# Patient Record
Sex: Male | Born: 1935 | Race: White | Hispanic: No | Marital: Single
Health system: Southern US, Community
[De-identification: ages and names within clinical notes are randomized; demographics above are authoritative.]

---

## 2003-12-09 ENCOUNTER — Inpatient Hospital Stay (HOSPITAL_COMMUNITY): Admission: EM | Admit: 2003-12-09 | Discharge: 2003-12-14 | Payer: Self-pay | Admitting: *Deleted

## 2003-12-10 ENCOUNTER — Encounter: Payer: Self-pay | Admitting: Cardiovascular Disease

## 2003-12-13 ENCOUNTER — Encounter: Payer: Self-pay | Admitting: Cardiology

## 2003-12-14 ENCOUNTER — Inpatient Hospital Stay (HOSPITAL_COMMUNITY)
Admission: RE | Admit: 2003-12-14 | Discharge: 2003-12-23 | Payer: Self-pay | Admitting: Physical Medicine & Rehabilitation

## 2004-02-01 ENCOUNTER — Inpatient Hospital Stay (HOSPITAL_COMMUNITY): Admission: EM | Admit: 2004-02-01 | Discharge: 2004-02-09 | Payer: Self-pay | Admitting: Emergency Medicine

## 2004-04-06 ENCOUNTER — Encounter
Admission: RE | Admit: 2004-04-06 | Discharge: 2004-07-05 | Payer: Self-pay | Admitting: Physical Medicine & Rehabilitation

## 2004-04-06 ENCOUNTER — Ambulatory Visit: Payer: Self-pay | Admitting: Physical Medicine & Rehabilitation

## 2004-04-25 ENCOUNTER — Ambulatory Visit: Payer: Self-pay | Admitting: Family Medicine

## 2004-05-10 ENCOUNTER — Ambulatory Visit: Payer: Self-pay | Admitting: Family Medicine

## 2004-05-24 ENCOUNTER — Ambulatory Visit: Payer: Self-pay | Admitting: Family Medicine

## 2004-06-07 ENCOUNTER — Ambulatory Visit: Payer: Self-pay | Admitting: Family Medicine

## 2004-06-21 ENCOUNTER — Ambulatory Visit: Payer: Self-pay | Admitting: Family Medicine

## 2004-07-19 ENCOUNTER — Ambulatory Visit: Payer: Self-pay | Admitting: Family Medicine

## 2004-08-02 ENCOUNTER — Ambulatory Visit: Payer: Self-pay | Admitting: Family Medicine

## 2004-08-18 ENCOUNTER — Ambulatory Visit: Payer: Self-pay | Admitting: Family Medicine

## 2004-09-18 ENCOUNTER — Ambulatory Visit: Payer: Self-pay | Admitting: Family Medicine

## 2004-10-18 ENCOUNTER — Ambulatory Visit: Payer: Self-pay | Admitting: Family Medicine

## 2004-12-08 ENCOUNTER — Ambulatory Visit: Payer: Self-pay | Admitting: Internal Medicine

## 2004-12-22 ENCOUNTER — Ambulatory Visit: Payer: Self-pay | Admitting: Family Medicine

## 2005-01-10 ENCOUNTER — Ambulatory Visit: Payer: Self-pay | Admitting: Family Medicine

## 2005-01-19 ENCOUNTER — Ambulatory Visit: Payer: Self-pay | Admitting: Family Medicine

## 2005-02-20 ENCOUNTER — Ambulatory Visit: Payer: Self-pay | Admitting: Family Medicine

## 2005-02-21 ENCOUNTER — Ambulatory Visit: Payer: Self-pay | Admitting: Family Medicine

## 2005-03-10 ENCOUNTER — Inpatient Hospital Stay (HOSPITAL_COMMUNITY): Admission: EM | Admit: 2005-03-10 | Discharge: 2005-03-11 | Payer: Self-pay | Admitting: Emergency Medicine

## 2005-03-10 ENCOUNTER — Ambulatory Visit: Payer: Self-pay | Admitting: Internal Medicine

## 2005-03-13 ENCOUNTER — Ambulatory Visit: Payer: Self-pay | Admitting: Family Medicine

## 2005-03-20 ENCOUNTER — Ambulatory Visit: Payer: Self-pay

## 2005-03-23 ENCOUNTER — Ambulatory Visit: Payer: Self-pay | Admitting: Family Medicine

## 2005-03-30 ENCOUNTER — Ambulatory Visit: Payer: Self-pay | Admitting: Family Medicine

## 2005-05-03 ENCOUNTER — Ambulatory Visit: Payer: Self-pay | Admitting: Family Medicine

## 2005-05-17 ENCOUNTER — Ambulatory Visit: Payer: Self-pay | Admitting: Family Medicine

## 2005-06-05 ENCOUNTER — Ambulatory Visit: Payer: Self-pay | Admitting: Family Medicine

## 2005-11-15 ENCOUNTER — Ambulatory Visit: Payer: Self-pay | Admitting: Internal Medicine

## 2005-11-15 ENCOUNTER — Inpatient Hospital Stay (HOSPITAL_COMMUNITY): Admission: EM | Admit: 2005-11-15 | Discharge: 2005-11-19 | Payer: Self-pay | Admitting: Emergency Medicine

## 2005-11-19 ENCOUNTER — Ambulatory Visit: Payer: Self-pay | Admitting: Oncology

## 2005-11-21 ENCOUNTER — Ambulatory Visit: Payer: Self-pay | Admitting: Family Medicine

## 2006-02-06 ENCOUNTER — Ambulatory Visit: Payer: Self-pay | Admitting: *Deleted

## 2006-02-07 ENCOUNTER — Inpatient Hospital Stay (HOSPITAL_COMMUNITY): Admission: EM | Admit: 2006-02-07 | Discharge: 2006-02-14 | Payer: Self-pay | Admitting: Emergency Medicine

## 2006-02-08 ENCOUNTER — Ambulatory Visit: Payer: Self-pay | Admitting: Internal Medicine

## 2006-02-08 ENCOUNTER — Encounter (INDEPENDENT_AMBULATORY_CARE_PROVIDER_SITE_OTHER): Payer: Self-pay | Admitting: Cardiology

## 2006-03-26 ENCOUNTER — Ambulatory Visit: Payer: Self-pay | Admitting: Family Medicine

## 2007-02-01 ENCOUNTER — Emergency Department (HOSPITAL_COMMUNITY): Admission: EM | Admit: 2007-02-01 | Discharge: 2007-02-01 | Payer: Self-pay | Admitting: Emergency Medicine

## 2007-02-04 ENCOUNTER — Ambulatory Visit: Payer: Self-pay | Admitting: Family Medicine

## 2007-02-04 DIAGNOSIS — R55 Syncope and collapse: Secondary | ICD-10-CM

## 2007-02-04 DIAGNOSIS — E0590 Thyrotoxicosis, unspecified without thyrotoxic crisis or storm: Secondary | ICD-10-CM | POA: Insufficient documentation

## 2007-02-04 DIAGNOSIS — K219 Gastro-esophageal reflux disease without esophagitis: Secondary | ICD-10-CM | POA: Insufficient documentation

## 2007-02-04 DIAGNOSIS — Z86718 Personal history of other venous thrombosis and embolism: Secondary | ICD-10-CM | POA: Insufficient documentation

## 2007-02-04 DIAGNOSIS — F329 Major depressive disorder, single episode, unspecified: Secondary | ICD-10-CM

## 2007-02-04 DIAGNOSIS — I10 Essential (primary) hypertension: Secondary | ICD-10-CM | POA: Insufficient documentation

## 2007-02-18 ENCOUNTER — Ambulatory Visit: Payer: Self-pay | Admitting: Family Medicine

## 2007-02-18 LAB — CONVERTED CEMR LAB: Prothrombin Time: 13.6 s

## 2007-03-04 ENCOUNTER — Ambulatory Visit: Payer: Self-pay | Admitting: Family Medicine

## 2007-03-04 DIAGNOSIS — L0211 Cutaneous abscess of neck: Secondary | ICD-10-CM | POA: Insufficient documentation

## 2007-03-04 DIAGNOSIS — L03221 Cellulitis of neck: Secondary | ICD-10-CM

## 2007-03-18 ENCOUNTER — Ambulatory Visit: Payer: Self-pay | Admitting: Family Medicine

## 2007-07-02 ENCOUNTER — Telehealth: Payer: Self-pay | Admitting: Family Medicine

## 2007-08-15 ENCOUNTER — Telehealth: Payer: Self-pay | Admitting: Family Medicine

## 2008-02-16 ENCOUNTER — Ambulatory Visit: Payer: Self-pay | Admitting: Family Medicine

## 2008-02-16 LAB — CONVERTED CEMR LAB
INR: 1.1
Prothrombin Time: 12.9 s

## 2008-02-18 LAB — CONVERTED CEMR LAB
ALT: 16 units/L (ref 0–53)
AST: 19 units/L (ref 0–37)
Albumin: 3 g/dL — ABNORMAL LOW (ref 3.5–5.2)
BUN: 22 mg/dL (ref 6–23)
Basophils Absolute: 0 10*3/uL (ref 0.0–0.1)
Basophils Relative: 0.7 % (ref 0.0–3.0)
Chloride: 111 meq/L (ref 96–112)
Cholesterol: 235 mg/dL (ref 0–200)
Creatinine, Ser: 1.4 mg/dL (ref 0.4–1.5)
Eosinophils Relative: 2.2 % (ref 0.0–5.0)
GFR calc Af Amer: 64 mL/min
Glucose, Bld: 160 mg/dL — ABNORMAL HIGH (ref 70–99)
HDL: 27.6 mg/dL — ABNORMAL LOW (ref >39.0)
Monocytes Absolute: 0.4 10*3/uL (ref 0.1–1.0)
Monocytes Relative: 6.6 % (ref 3.0–12.0)
Neutro Abs: 3.8 10*3/uL (ref 1.4–7.7)
Platelets: 291 10*3/uL (ref 150–400)
Potassium: 3.2 meq/L — ABNORMAL LOW (ref 3.5–5.1)
RBC: 4.42 M/uL (ref 4.22–5.81)
RDW: 11.8 % (ref 11.5–14.6)
Total Protein: 6.8 g/dL (ref 6.0–8.3)
Vitamin B-12: 310 pg/mL (ref 211–911)
WBC: 6.7 10*3/uL (ref 4.5–10.5)

## 2008-02-24 ENCOUNTER — Ambulatory Visit: Payer: Self-pay | Admitting: Family Medicine

## 2008-02-24 LAB — CONVERTED CEMR LAB: INR: 1.7

## 2008-03-03 ENCOUNTER — Ambulatory Visit: Payer: Self-pay | Admitting: Family Medicine

## 2008-03-03 DIAGNOSIS — E119 Type 2 diabetes mellitus without complications: Secondary | ICD-10-CM

## 2008-03-05 LAB — CONVERTED CEMR LAB: Hgb A1c MFr Bld: 6.3 % — ABNORMAL HIGH (ref 4.6–6.0)

## 2008-03-16 ENCOUNTER — Ambulatory Visit: Payer: Self-pay | Admitting: Family Medicine

## 2008-03-16 LAB — CONVERTED CEMR LAB: INR: 8

## 2008-04-01 ENCOUNTER — Ambulatory Visit: Payer: Self-pay | Admitting: Family Medicine

## 2008-04-16 ENCOUNTER — Ambulatory Visit: Payer: Self-pay | Admitting: Family Medicine

## 2008-04-16 LAB — CONVERTED CEMR LAB
INR: 1.1
Prothrombin Time: 13.3 s

## 2008-05-17 ENCOUNTER — Ambulatory Visit: Payer: Self-pay | Admitting: Family Medicine

## 2008-05-17 LAB — CONVERTED CEMR LAB
INR: 1
Prothrombin Time: 12.5 s

## 2008-06-01 ENCOUNTER — Encounter: Payer: Self-pay | Admitting: Family Medicine

## 2008-06-05 ENCOUNTER — Ambulatory Visit: Payer: Self-pay | Admitting: Diagnostic Radiology

## 2008-06-05 ENCOUNTER — Emergency Department (HOSPITAL_BASED_OUTPATIENT_CLINIC_OR_DEPARTMENT_OTHER): Admission: EM | Admit: 2008-06-05 | Discharge: 2008-06-05 | Payer: Self-pay | Admitting: Emergency Medicine

## 2008-06-08 ENCOUNTER — Ambulatory Visit: Payer: Self-pay | Admitting: Family Medicine

## 2008-06-08 DIAGNOSIS — S20219A Contusion of unspecified front wall of thorax, initial encounter: Secondary | ICD-10-CM

## 2008-06-08 DIAGNOSIS — S5000XA Contusion of unspecified elbow, initial encounter: Secondary | ICD-10-CM

## 2008-06-15 ENCOUNTER — Ambulatory Visit: Payer: Self-pay | Admitting: Family Medicine

## 2008-06-15 LAB — CONVERTED CEMR LAB
INR: 5.4
Prothrombin Time: 28.2 s

## 2008-06-29 ENCOUNTER — Ambulatory Visit: Payer: Self-pay | Admitting: Family Medicine

## 2008-07-14 ENCOUNTER — Emergency Department (HOSPITAL_COMMUNITY): Admission: EM | Admit: 2008-07-14 | Discharge: 2008-07-14 | Payer: Self-pay | Admitting: Emergency Medicine

## 2009-01-28 ENCOUNTER — Ambulatory Visit: Payer: Self-pay | Admitting: Family Medicine

## 2009-01-28 DIAGNOSIS — N401 Enlarged prostate with lower urinary tract symptoms: Secondary | ICD-10-CM

## 2009-01-28 DIAGNOSIS — E039 Hypothyroidism, unspecified: Secondary | ICD-10-CM | POA: Insufficient documentation

## 2009-02-02 LAB — CONVERTED CEMR LAB
AST: 20 units/L (ref 0–37)
Basophils Absolute: 0 10*3/uL (ref 0.0–0.1)
Basophils Relative: 0.7 % (ref 0.0–3.0)
CO2: 29 meq/L (ref 19–32)
Calcium: 8.6 mg/dL (ref 8.4–10.5)
Chloride: 106 meq/L (ref 96–112)
Creatinine, Ser: 1.4 mg/dL (ref 0.4–1.5)
Direct LDL: 220.1 mg/dL
GFR calc non Af Amer: 52.97 mL/min (ref >60)
HCT: 40.9 % (ref 39.0–52.0)
Hgb A1c MFr Bld: 6 % (ref 4.6–6.5)
Lymphocytes Relative: 32.2 % (ref 12.0–46.0)
Lymphs Abs: 2.3 10*3/uL (ref 0.7–4.0)
Neutro Abs: 4.3 10*3/uL (ref 1.4–7.7)
Neutrophils Relative %: 60.4 % (ref 43.0–77.0)
Platelets: 263 10*3/uL (ref 150.0–400.0)
Sodium: 142 meq/L (ref 135–145)
Total Bilirubin: 1.4 mg/dL — ABNORMAL HIGH (ref 0.3–1.2)
Total CHOL/HDL Ratio: 10
Total Protein: 6.6 g/dL (ref 6.0–8.3)
Triglycerides: 150 mg/dL — ABNORMAL HIGH (ref 0.0–149.0)
VLDL: 30 mg/dL (ref 0.0–40.0)

## 2009-02-04 ENCOUNTER — Telehealth: Payer: Self-pay | Admitting: Family Medicine

## 2009-03-21 ENCOUNTER — Telehealth: Payer: Self-pay | Admitting: Family Medicine

## 2009-06-20 ENCOUNTER — Inpatient Hospital Stay (HOSPITAL_COMMUNITY): Admission: EM | Admit: 2009-06-20 | Discharge: 2009-06-22 | Payer: Self-pay | Admitting: Emergency Medicine

## 2009-06-21 ENCOUNTER — Ambulatory Visit: Payer: Self-pay | Admitting: Surgery

## 2009-06-21 ENCOUNTER — Encounter (INDEPENDENT_AMBULATORY_CARE_PROVIDER_SITE_OTHER): Payer: Self-pay | Admitting: Internal Medicine

## 2009-06-21 ENCOUNTER — Encounter (INDEPENDENT_AMBULATORY_CARE_PROVIDER_SITE_OTHER): Payer: Self-pay | Admitting: Emergency Medicine

## 2009-06-21 ENCOUNTER — Encounter: Payer: Self-pay | Admitting: Family Medicine

## 2009-08-24 ENCOUNTER — Telehealth: Payer: Self-pay | Admitting: Family Medicine

## 2009-09-06 ENCOUNTER — Ambulatory Visit: Payer: Self-pay | Admitting: Family Medicine

## 2009-09-06 DIAGNOSIS — I4891 Unspecified atrial fibrillation: Secondary | ICD-10-CM

## 2009-09-06 DIAGNOSIS — I509 Heart failure, unspecified: Secondary | ICD-10-CM | POA: Insufficient documentation

## 2009-09-06 DIAGNOSIS — I2699 Other pulmonary embolism without acute cor pulmonale: Secondary | ICD-10-CM

## 2009-09-07 ENCOUNTER — Telehealth: Payer: Self-pay | Admitting: Family Medicine

## 2009-09-07 LAB — CONVERTED CEMR LAB
ALT: 13 units/L (ref 0–53)
AST: 16 units/L (ref 0–37)
Alkaline Phosphatase: 49 units/L (ref 39–117)
BUN: 23 mg/dL (ref 6–23)
Basophils Absolute: 0 10*3/uL (ref 0.0–0.1)
CO2: 28 meq/L (ref 19–32)
Calcium: 8.7 mg/dL (ref 8.4–10.5)
Eosinophils Absolute: 0.2 10*3/uL (ref 0.0–0.7)
GFR calc non Af Amer: 52.88 mL/min (ref >60)
HCT: 39.9 % (ref 39.0–52.0)
Lymphs Abs: 2 10*3/uL (ref 0.7–4.0)
MCV: 94 fL (ref 78.0–100.0)
Monocytes Absolute: 0.4 10*3/uL (ref 0.1–1.0)
Neutrophils Relative %: 59.3 % (ref 43.0–77.0)
Platelets: 225 10*3/uL (ref 150.0–400.0)
Pro B Natriuretic peptide (BNP): 323 pg/mL — ABNORMAL HIGH (ref 0.0–100.0)
RDW: 12.1 % (ref 11.5–14.6)
TSH: 2.82 microintl units/mL (ref 0.35–5.50)
Total Protein: 6.6 g/dL (ref 6.0–8.3)
Vitamin B-12: 358 pg/mL (ref 211–911)
WBC: 6.5 10*3/uL (ref 4.5–10.5)

## 2009-09-26 ENCOUNTER — Ambulatory Visit: Payer: Self-pay | Admitting: Cardiology

## 2009-09-26 DIAGNOSIS — I428 Other cardiomyopathies: Secondary | ICD-10-CM | POA: Insufficient documentation

## 2009-09-26 DIAGNOSIS — E785 Hyperlipidemia, unspecified: Secondary | ICD-10-CM

## 2009-09-27 ENCOUNTER — Telehealth (INDEPENDENT_AMBULATORY_CARE_PROVIDER_SITE_OTHER): Payer: Self-pay | Admitting: *Deleted

## 2009-09-28 ENCOUNTER — Ambulatory Visit: Payer: Self-pay | Admitting: Cardiovascular Disease

## 2009-09-28 ENCOUNTER — Encounter (HOSPITAL_COMMUNITY): Admission: RE | Admit: 2009-09-28 | Discharge: 2009-11-17 | Payer: Self-pay | Admitting: Cardiology

## 2009-09-28 ENCOUNTER — Ambulatory Visit: Payer: Self-pay

## 2009-10-04 ENCOUNTER — Ambulatory Visit: Payer: Self-pay | Admitting: Family Medicine

## 2009-10-04 LAB — CONVERTED CEMR LAB
INR: 3.5
Prothrombin Time: 22.7 s

## 2009-10-20 ENCOUNTER — Ambulatory Visit: Payer: Self-pay | Admitting: Cardiology

## 2009-11-03 ENCOUNTER — Ambulatory Visit: Payer: Self-pay | Admitting: Cardiology

## 2009-11-03 ENCOUNTER — Ambulatory Visit (HOSPITAL_COMMUNITY): Admission: RE | Admit: 2009-11-03 | Discharge: 2009-11-03 | Payer: Self-pay | Admitting: Cardiology

## 2009-11-03 ENCOUNTER — Ambulatory Visit: Payer: Self-pay | Admitting: Internal Medicine

## 2009-11-03 ENCOUNTER — Ambulatory Visit: Payer: Self-pay

## 2009-11-03 ENCOUNTER — Encounter: Payer: Self-pay | Admitting: Cardiology

## 2009-11-03 ENCOUNTER — Telehealth: Payer: Self-pay | Admitting: Cardiology

## 2009-11-09 ENCOUNTER — Telehealth: Payer: Self-pay | Admitting: Cardiology

## 2009-11-09 LAB — CONVERTED CEMR LAB
BUN: 21 mg/dL (ref 6–23)
Calcium: 8.5 mg/dL (ref 8.4–10.5)
Glucose, Bld: 112 mg/dL — ABNORMAL HIGH (ref 70–99)
Potassium: 3.7 meq/L (ref 3.5–5.1)

## 2009-11-15 ENCOUNTER — Ambulatory Visit: Payer: Self-pay | Admitting: Family Medicine

## 2009-11-16 ENCOUNTER — Telehealth: Payer: Self-pay | Admitting: Family Medicine

## 2009-12-13 ENCOUNTER — Ambulatory Visit: Payer: Self-pay | Admitting: Family Medicine

## 2010-01-11 ENCOUNTER — Ambulatory Visit: Payer: Self-pay | Admitting: Family Medicine

## 2010-01-11 LAB — CONVERTED CEMR LAB: INR: 3.6

## 2010-01-26 ENCOUNTER — Observation Stay (HOSPITAL_COMMUNITY): Admission: EM | Admit: 2010-01-26 | Discharge: 2010-01-27 | Payer: Self-pay | Admitting: Emergency Medicine

## 2010-02-06 ENCOUNTER — Telehealth: Payer: Self-pay | Admitting: Family Medicine

## 2010-02-08 ENCOUNTER — Ambulatory Visit: Payer: Self-pay | Admitting: Family Medicine

## 2010-02-13 ENCOUNTER — Ambulatory Visit: Payer: Self-pay | Admitting: Family Medicine

## 2010-02-14 ENCOUNTER — Telehealth: Payer: Self-pay | Admitting: Family Medicine

## 2010-03-08 ENCOUNTER — Ambulatory Visit: Payer: Self-pay | Admitting: Family Medicine

## 2010-03-08 LAB — CONVERTED CEMR LAB: INR: 4.1

## 2010-03-22 ENCOUNTER — Ambulatory Visit: Payer: Self-pay | Admitting: Family Medicine

## 2010-04-19 ENCOUNTER — Ambulatory Visit: Payer: Self-pay | Admitting: Family Medicine

## 2010-05-08 ENCOUNTER — Inpatient Hospital Stay (HOSPITAL_COMMUNITY)
Admission: EM | Admit: 2010-05-08 | Discharge: 2010-05-10 | Payer: Self-pay | Source: Home / Self Care | Admitting: Emergency Medicine

## 2010-05-08 ENCOUNTER — Ambulatory Visit: Payer: Self-pay | Admitting: Cardiology

## 2010-05-15 ENCOUNTER — Telehealth: Payer: Self-pay | Admitting: Family Medicine

## 2010-05-28 ENCOUNTER — Inpatient Hospital Stay (HOSPITAL_COMMUNITY)
Admission: EM | Admit: 2010-05-28 | Discharge: 2010-06-01 | Payer: Self-pay | Source: Home / Self Care | Attending: Internal Medicine | Admitting: Internal Medicine

## 2010-05-29 ENCOUNTER — Ambulatory Visit: Payer: Self-pay | Admitting: Family Medicine

## 2010-05-29 ENCOUNTER — Encounter (INDEPENDENT_AMBULATORY_CARE_PROVIDER_SITE_OTHER): Payer: Self-pay | Admitting: Family Medicine

## 2010-05-30 ENCOUNTER — Encounter: Payer: Self-pay | Admitting: Family Medicine

## 2010-05-31 ENCOUNTER — Encounter (INDEPENDENT_AMBULATORY_CARE_PROVIDER_SITE_OTHER): Payer: Self-pay | Admitting: Family Medicine

## 2010-06-23 ENCOUNTER — Emergency Department (HOSPITAL_COMMUNITY)
Admission: EM | Admit: 2010-06-23 | Discharge: 2010-06-23 | Payer: Self-pay | Source: Home / Self Care | Admitting: Emergency Medicine

## 2010-06-23 ENCOUNTER — Inpatient Hospital Stay (HOSPITAL_COMMUNITY)
Admission: AD | Admit: 2010-06-23 | Discharge: 2010-07-19 | Disposition: E | Payer: Self-pay | Attending: Pulmonary Disease | Admitting: Pulmonary Disease

## 2010-06-23 LAB — URINALYSIS, ROUTINE W REFLEX MICROSCOPIC
Bilirubin Urine: NEGATIVE
Hemoglobin, Urine: NEGATIVE
Ketones, ur: NEGATIVE mg/dL
Leukocytes, UA: NEGATIVE
Nitrite: NEGATIVE
Protein, ur: 100 mg/dL — AB
Specific Gravity, Urine: 1.02 (ref 1.005–1.030)
Urine Glucose, Fasting: NEGATIVE mg/dL
Urobilinogen, UA: 0.2 mg/dL (ref 0.0–1.0)
pH: 5.5 (ref 5.0–8.0)

## 2010-06-23 LAB — URINE MICROSCOPIC-ADD ON

## 2010-06-23 LAB — RAPID URINE DRUG SCREEN, HOSP PERFORMED
Amphetamines: NOT DETECTED
Barbiturates: NOT DETECTED
Benzodiazepines: NOT DETECTED
Cocaine: NOT DETECTED
Opiates: NOT DETECTED
Tetrahydrocannabinol: NOT DETECTED

## 2010-06-28 ENCOUNTER — Encounter (INDEPENDENT_AMBULATORY_CARE_PROVIDER_SITE_OTHER): Payer: Self-pay | Admitting: Internal Medicine

## 2010-06-28 ENCOUNTER — Encounter: Payer: Self-pay | Admitting: Family Medicine

## 2010-07-03 LAB — BLOOD GAS, ARTERIAL
Acid-Base Excess: 0.3 mmol/L (ref 0.0–2.0)
Acid-base deficit: 2.9 mmol/L — ABNORMAL HIGH (ref 0.0–2.0)
Bicarbonate: 23.9 mEq/L (ref 20.0–24.0)
Bicarbonate: 20.6 mEq/L (ref 20.0–24.0)
Drawn by: 31288
Drawn by: 129771
FIO2: 0.6 %
FIO2: 0.3 %
MECHVT: 600 mL
O2 Saturation: 99.8 %
O2 Saturation: 98.8 %
PEEP: 5 cmH2O
PEEP: 5 cmH2O
Patient temperature: 100
Patient temperature: 98.6
Pressure support: 5 cmH2O
RATE: 12 resp/min
TCO2: 24.9 mmol/L (ref 0–100)
TCO2: 21.6 mmol/L (ref 0–100)
pCO2 arterial: 36.3 mmHg (ref 35.0–45.0)
pCO2 arterial: 30.8 mmHg — ABNORMAL LOW (ref 35.0–45.0)
pH, Arterial: 7.437 (ref 7.350–7.450)
pH, Arterial: 7.441 (ref 7.350–7.450)
pO2, Arterial: 248 mmHg — ABNORMAL HIGH (ref 80.0–100.0)
pO2, Arterial: 105 mmHg — ABNORMAL HIGH (ref 80.0–100.0)

## 2010-07-03 LAB — BASIC METABOLIC PANEL
BUN: 29 mg/dL — ABNORMAL HIGH (ref 6–23)
BUN: 27 mg/dL — ABNORMAL HIGH (ref 6–23)
BUN: 18 mg/dL (ref 6–23)
BUN: 20 mg/dL (ref 6–23)
BUN: 10 mg/dL (ref 6–23)
CO2: 26 mEq/L (ref 19–32)
CO2: 27 mEq/L (ref 19–32)
CO2: 24 mEq/L (ref 19–32)
CO2: 22 mEq/L (ref 19–32)
CO2: 24 mEq/L (ref 19–32)
Calcium: 9.2 mg/dL (ref 8.4–10.5)
Calcium: 8.9 mg/dL (ref 8.4–10.5)
Calcium: 8.6 mg/dL (ref 8.4–10.5)
Calcium: 8.8 mg/dL (ref 8.4–10.5)
Calcium: 9 mg/dL (ref 8.4–10.5)
Chloride: 112 mEq/L (ref 96–112)
Chloride: 118 mEq/L — ABNORMAL HIGH (ref 96–112)
Chloride: 121 mEq/L — ABNORMAL HIGH (ref 96–112)
Chloride: 124 mEq/L — ABNORMAL HIGH (ref 96–112)
Chloride: 110 mEq/L (ref 96–112)
Creatinine, Ser: 1.49 mg/dL (ref 0.4–1.5)
Creatinine, Ser: 1.61 mg/dL — ABNORMAL HIGH (ref 0.4–1.5)
Creatinine, Ser: 1.24 mg/dL (ref 0.4–1.5)
Creatinine, Ser: 1.38 mg/dL (ref 0.4–1.5)
Creatinine, Ser: 0.69 mg/dL (ref 0.4–1.5)
GFR calc Af Amer: 56 mL/min — ABNORMAL LOW (ref >60)
GFR calc Af Amer: 51 mL/min — ABNORMAL LOW (ref >60)
GFR calc Af Amer: >60 mL/min (ref >60)
GFR calc Af Amer: >60 mL/min (ref >60)
GFR calc Af Amer: >60 mL/min (ref >60)
GFR calc non Af Amer: 46 mL/min — ABNORMAL LOW (ref >60)
GFR calc non Af Amer: 42 mL/min — ABNORMAL LOW (ref >60)
GFR calc non Af Amer: 57 mL/min — ABNORMAL LOW (ref >60)
GFR calc non Af Amer: 51 mL/min — ABNORMAL LOW (ref >60)
GFR calc non Af Amer: >60 mL/min (ref >60)
Glucose, Bld: 166 mg/dL — ABNORMAL HIGH (ref 70–99)
Glucose, Bld: 173 mg/dL — ABNORMAL HIGH (ref 70–99)
Glucose, Bld: 132 mg/dL — ABNORMAL HIGH (ref 70–99)
Glucose, Bld: 149 mg/dL — ABNORMAL HIGH (ref 70–99)
Glucose, Bld: 146 mg/dL — ABNORMAL HIGH (ref 70–99)
Potassium: 3.6 mEq/L (ref 3.5–5.1)
Potassium: 3.6 mEq/L (ref 3.5–5.1)
Potassium: 3.4 mEq/L — ABNORMAL LOW (ref 3.5–5.1)
Potassium: 3.4 mEq/L — ABNORMAL LOW (ref 3.5–5.1)
Potassium: 4 mEq/L (ref 3.5–5.1)
Sodium: 146 mEq/L — ABNORMAL HIGH (ref 135–145)
Sodium: 150 mEq/L — ABNORMAL HIGH (ref 135–145)
Sodium: 152 mEq/L — ABNORMAL HIGH (ref 135–145)
Sodium: 153 mEq/L — ABNORMAL HIGH (ref 135–145)
Sodium: 141 mEq/L (ref 135–145)

## 2010-07-03 LAB — DIFFERENTIAL
Basophils Absolute: 0 10*3/uL (ref 0.0–0.1)
Basophils Absolute: 0 10*3/uL (ref 0.0–0.1)
Basophils Relative: 0 % (ref 0–1)
Basophils Relative: 0 % (ref 0–1)
Eosinophils Absolute: 0 10*3/uL (ref 0.0–0.7)
Eosinophils Absolute: 0.1 10*3/uL (ref 0.0–0.7)
Eosinophils Relative: 0 % (ref 0–5)
Eosinophils Relative: 1 % (ref 0–5)
Lymphocytes Relative: 11 % — ABNORMAL LOW (ref 12–46)
Lymphocytes Relative: 14 % (ref 12–46)
Lymphs Abs: 1.4 10*3/uL (ref 0.7–4.0)
Lymphs Abs: 1.1 10*3/uL (ref 0.7–4.0)
Monocytes Absolute: 0.7 10*3/uL (ref 0.1–1.0)
Monocytes Absolute: 0.6 10*3/uL (ref 0.1–1.0)
Monocytes Relative: 5 % (ref 3–12)
Monocytes Relative: 7 % (ref 3–12)
Neutro Abs: 11.4 10*3/uL — ABNORMAL HIGH (ref 1.7–7.7)
Neutro Abs: 6.6 10*3/uL (ref 1.7–7.7)
Neutrophils Relative %: 84 % — ABNORMAL HIGH (ref 43–77)
Neutrophils Relative %: 79 % — ABNORMAL HIGH (ref 43–77)

## 2010-07-03 LAB — CBC
HCT: 39.3 % (ref 39.0–52.0)
HCT: 37 % — ABNORMAL LOW (ref 39.0–52.0)
HCT: 33.7 % — ABNORMAL LOW (ref 39.0–52.0)
HCT: 33.2 % — ABNORMAL LOW (ref 39.0–52.0)
HCT: 35 % — ABNORMAL LOW (ref 39.0–52.0)
Hemoglobin: 12.7 g/dL — ABNORMAL LOW (ref 13.0–17.0)
Hemoglobin: 12.2 g/dL — ABNORMAL LOW (ref 13.0–17.0)
Hemoglobin: 10.6 g/dL — ABNORMAL LOW (ref 13.0–17.0)
Hemoglobin: 10.6 g/dL — ABNORMAL LOW (ref 13.0–17.0)
Hemoglobin: 10.9 g/dL — ABNORMAL LOW (ref 13.0–17.0)
MCH: 30.8 pg (ref 26.0–34.0)
MCH: 31.3 pg (ref 26.0–34.0)
MCH: 30.3 pg (ref 26.0–34.0)
MCH: 31.2 pg (ref 26.0–34.0)
MCH: 30.3 pg (ref 26.0–34.0)
MCHC: 32.3 g/dL (ref 30.0–36.0)
MCHC: 33 g/dL (ref 30.0–36.0)
MCHC: 31.5 g/dL (ref 30.0–36.0)
MCHC: 31.9 g/dL (ref 30.0–36.0)
MCHC: 31.1 g/dL (ref 30.0–36.0)
MCV: 95.2 fL (ref 78.0–100.0)
MCV: 94.9 fL (ref 78.0–100.0)
MCV: 96.3 fL (ref 78.0–100.0)
MCV: 97.6 fL (ref 78.0–100.0)
MCV: 97.2 fL (ref 78.0–100.0)
Platelets: 278 10*3/uL (ref 150–400)
Platelets: 276 10*3/uL (ref 150–400)
Platelets: 196 10*3/uL (ref 150–400)
Platelets: 207 10*3/uL (ref 150–400)
Platelets: 205 10*3/uL (ref 150–400)
RBC: 4.13 MIL/uL — ABNORMAL LOW (ref 4.22–5.81)
RBC: 3.9 MIL/uL — ABNORMAL LOW (ref 4.22–5.81)
RBC: 3.5 MIL/uL — ABNORMAL LOW (ref 4.22–5.81)
RBC: 3.4 MIL/uL — ABNORMAL LOW (ref 4.22–5.81)
RBC: 3.6 MIL/uL — ABNORMAL LOW (ref 4.22–5.81)
RDW: 13.1 % (ref 11.5–15.5)
RDW: 13.4 % (ref 11.5–15.5)
RDW: 13.7 % (ref 11.5–15.5)
RDW: 13.9 % (ref 11.5–15.5)
RDW: 13.7 % (ref 11.5–15.5)
WBC: 13.6 10*3/uL — ABNORMAL HIGH (ref 4.0–10.5)
WBC: 13.7 10*3/uL — ABNORMAL HIGH (ref 4.0–10.5)
WBC: 8.4 10*3/uL (ref 4.0–10.5)
WBC: 7.7 10*3/uL (ref 4.0–10.5)
WBC: 10.4 10*3/uL (ref 4.0–10.5)

## 2010-07-03 LAB — GLUCOSE, CAPILLARY
Glucose-Capillary: 155 mg/dL — ABNORMAL HIGH (ref 70–99)
Glucose-Capillary: 138 mg/dL — ABNORMAL HIGH (ref 70–99)
Glucose-Capillary: 138 mg/dL — ABNORMAL HIGH (ref 70–99)
Glucose-Capillary: 141 mg/dL — ABNORMAL HIGH (ref 70–99)
Glucose-Capillary: 142 mg/dL — ABNORMAL HIGH (ref 70–99)
Glucose-Capillary: 143 mg/dL — ABNORMAL HIGH (ref 70–99)
Glucose-Capillary: 127 mg/dL — ABNORMAL HIGH (ref 70–99)
Glucose-Capillary: 129 mg/dL — ABNORMAL HIGH (ref 70–99)
Glucose-Capillary: 140 mg/dL — ABNORMAL HIGH (ref 70–99)
Glucose-Capillary: 130 mg/dL — ABNORMAL HIGH (ref 70–99)
Glucose-Capillary: 147 mg/dL — ABNORMAL HIGH (ref 70–99)
Glucose-Capillary: 151 mg/dL — ABNORMAL HIGH (ref 70–99)
Glucose-Capillary: 157 mg/dL — ABNORMAL HIGH (ref 70–99)
Glucose-Capillary: 160 mg/dL — ABNORMAL HIGH (ref 70–99)
Glucose-Capillary: 168 mg/dL — ABNORMAL HIGH (ref 70–99)
Glucose-Capillary: 171 mg/dL — ABNORMAL HIGH (ref 70–99)
Glucose-Capillary: 204 mg/dL — ABNORMAL HIGH (ref 70–99)

## 2010-07-03 LAB — PREPARE FRESH FROZEN PLASMA
Unit division: 0
Unit division: 0
Unit division: 0
Unit division: 0

## 2010-07-03 LAB — APTT
aPTT: 32 seconds (ref 24–37)
aPTT: 27 seconds (ref 24–37)
aPTT: 25 seconds (ref 24–37)

## 2010-07-03 LAB — PROTIME-INR
INR: 1.2 (ref 0.00–1.49)
INR: 1.11 (ref 0.00–1.49)
Prothrombin Time: 15.6 seconds — ABNORMAL HIGH (ref 11.6–15.2)
Prothrombin Time: 15.4 seconds — ABNORMAL HIGH (ref 11.6–15.2)
Prothrombin Time: 14.5 seconds (ref 11.6–15.2)

## 2010-07-03 LAB — URINE CULTURE
Colony Count: NO GROWTH
Culture  Setup Time: 201201070216
Culture: NO GROWTH

## 2010-07-03 LAB — TYPE AND SCREEN
ABO/RH(D): A POS
Antibody Screen: NEGATIVE

## 2010-07-03 LAB — SAMPLE TO BLOOD BANK

## 2010-07-03 LAB — CULTURE, RESPIRATORY W GRAM STAIN

## 2010-07-03 LAB — ABO/RH: ABO/RH(D): A POS

## 2010-07-03 LAB — MRSA PCR SCREENING: MRSA by PCR: NEGATIVE

## 2010-07-03 LAB — MAGNESIUM: Magnesium: 2.1 mg/dL (ref 1.5–2.5)

## 2010-07-16 LAB — CONVERTED CEMR LAB
Albumin: 3.3 g/dL — ABNORMAL LOW (ref 3.5–5.2)
BUN: 24 mg/dL — ABNORMAL HIGH (ref 6–23)
CO2: 30 meq/L (ref 19–32)
Calcium: 8.6 mg/dL (ref 8.4–10.5)
Direct LDL: 159.7 mg/dL
GFR calc non Af Amer: 52.87 mL/min (ref >60)
Glucose, Bld: 93 mg/dL (ref 70–99)
Prothrombin Time: 12.4 s
VLDL: 31.2 mg/dL (ref 0.0–40.0)

## 2010-07-18 NOTE — Assessment & Plan Note (Signed)
Summary: PT//CCM/PT RSC/CJR  Nurse Visit   Allergies: 1)  ! Tylenol Laboratory Results   Blood Tests   Date/Time Received: Nov 15, 2009 2:40 PM  Date/Time Reported: Nov 15, 2009 2:40 PM    INR: 1.7   (Normal Range: 0.88-1.12   Therap INR: 2.0-3.5) Comments: Wynona Canes, CMA  Nov 15, 2009 2:40 PM     Orders Added: 1)  Est. Patient Level I [99211] 2)  Protime [04540JW]  Laboratory Results   Blood Tests      INR: 1.7   (Normal Range: 0.88-1.12   Therap INR: 2.0-3.5) Comments: Wynona Canes, CMA  Nov 15, 2009 2:40 PM       ANTICOAGULATION RECORD PREVIOUS REGIMEN & LAB RESULTS Anticoagulation Diagnosis:  V58.83,V58.61,415.19 on  02/16/2008 Previous INR Goal Range:  2.0-3.0 on  02/16/2008 Previous INR:  3.5 on  10/04/2009 Previous Coumadin Dose(mg):  10mg  qd on  04/16/2008 Previous Regimen:  same on  10/04/2009 Previous Coagulation Comments:  Missed 1 day on  04/16/2008  NEW REGIMEN & LAB RESULTS Current INR: 1.7 Current Coumadin Dose(mg): 10mg  on tue,thu 7.5mg  on other days Regimen: same  (no change)  MEDICATIONS MULTIVITAMINS   TABS (MULTIPLE VITAMIN) 1 by mouth once daily KLOR-CON M20 20 MEQ TBCR (POTASSIUM CHLORIDE CRYS CR) take one tablet once daily COUMADIN 5 MG TABS (WARFARIN SODIUM) 10 mg 3 days, 7.5 mg 4 days CITRUCEL   POWD (METHYLCELLULOSE (LAXATIVE)) once daily ZOLOFT 25 MG  TABS (SERTRALINE HCL) 3 once daily RAMIPRIL 10 MG CAPS (RAMIPRIL) two tablets daily METFORMIN HCL 500 MG TABS (METFORMIN HCL) 1 once daily OMEPRAZOLE 20 MG CPDR (OMEPRAZOLE) one by mouth daily SIMVASTATIN 40 MG TABS (SIMVASTATIN) one tablet daily BISOPROLOL FUMARATE 5 MG TABS (BISOPROLOL FUMARATE) one tablet daily CATAPRES 0.2 MG TABS (CLONIDINE HCL) 1 once daily LEVOTHROID 125 MCG TABS (LEVOTHYROXINE SODIUM) take one tablet once daily   Anticoagulation Visit Questionnaire      Coumadin dose missed/changed:  No      Abnormal Bleeding Symptoms:  No   Any diet  changes including alcohol intake, vegetables or greens since the last visit:  No Any illnesses or hospitalizations since the last visit:  No Any signs of clotting since the last visit (including chest discomfort, dizziness, shortness of breath, arm tingling, slurred speech, swelling or redness in leg):  No

## 2010-07-18 NOTE — Progress Notes (Signed)
Summary: calling back  Phone Note Call from Patient Call back at Home Phone [phone]   Caller: Daughter- Lawson Fiscal [phone] Reason for Call: Talk to Nurse Summary of Call: calling back to speak with ann Initial call taken by: Lorne Skeens,  Nov 09, 2009 1:13 PM  Follow-up for Phone Call        [facility] --talked with daughter ,Lawson Fiscal

## 2010-07-18 NOTE — Assessment & Plan Note (Signed)
Summary: dizzy/pt keeps falling/cjr   Vital Signs:  Patient profile:   76 year old male Weight:      241 pounds BMI:     31.05 Temp:     97.5 degrees F oral Pulse rate:   72 / minute Pulse rhythm:   regular BP sitting:   138 / 102  (left arm) Cuff size:   regular  Vitals Entered By: Raechel Ache, RN (September 06, 2009 2:21 PM) CC: Feels lousy- discharged from hosp about 1 mo ago; falling, weak, dizzy and hard to get up out of chair. Is Patient Diabetic? Yes CBG Result 97   History of Present Illness: Here to follow up a hospital stay from 06-20-09 to 06-22-09 for a syncopal event that was felt to be due to orthostatic hypotension. He was taken off HCTZ and Norvasc. An ECHO then showed diffuse ventricular hypokinesis with an EF of 40-45%. Since goinghome he had done fairly well unti the last week. he has felt weak, and he has fallen several times. No major injuries fortunately. He still lives by himself. He is brought today by his daughter, Ashok Croon (cell # [phone]).   Allergies: 1)  ! Tylenol  Past History:  Past Medical History: Depression GERD Hypertension Hyporthyroidism PE's  Diabetes mellitus, type II  Past Surgical History: Reviewed history from 02/04/2007 and no changes required. Denies surgical history  Review of Systems  The patient denies anorexia, fever, weight loss, weight gain, vision loss, decreased hearing, hoarseness, chest pain, syncope, dyspnea on exertion, peripheral edema, prolonged cough, headaches, hemoptysis, abdominal pain, melena, hematochezia, severe indigestion/heartburn, hematuria, incontinence, genital sores, muscle weakness, suspicious skin lesions, transient blindness, difficulty walking, depression, unusual weight change, abnormal bleeding, enlarged lymph nodes, angioedema, breast masses, and testicular masses.    Physical Exam  General:  aler, disheveled and poor hygiene.  Smells of urine Neck:  No deformities, masses, or tenderness  noted. Lungs:  Normal respiratory effort, chest expands symmetrically. Lungs are clear to auscultation, no crackles or wheezes. Heart:  Normal rate and regular rhythm. S1 and S2 normal without gallop, murmur, click, rub or other extra sounds. Abdomen:  Bowel sounds positive,abdomen soft and non-tender without masses, organomegaly or hernias noted. Extremities:  no edema Neurologic:  alert & oriented X3 and gait normal.     Impression & Recommendations:  Problem # 1:  CHF (ICD-428.0)  The following medications were removed from the medication list:    Lopressor 100 Mg Tabs (Metoprolol tartrate) .Marland Kitchen... 1 by mouth two times a day    Hydrochlorothiazide 25 Mg Tabs (Hydrochlorothiazide) .Marland Kitchen... Take 2 tablet by mouth once a day His updated medication list for this problem includes:    Coumadin 5 Mg Tabs (Warfarin sodium) .Marland KitchenMarland KitchenMarland KitchenMarland Kitchen 10 mg 3 days, 7.5 mg 4 days    Ramipril 10 Mg Caps (Ramipril) .Marland Kitchen... 1 tablet by mouth daily    Metoprolol Tartrate 25 Mg Tabs (Metoprolol tartrate) .Marland KitchenMarland KitchenMarland KitchenMarland Kitchen 3 tabs two times a day    Cozaar 25 Mg Tabs (Losartan potassium) ..... Once daily  Orders: TLB-BNP (B-Natriuretic Peptide) (83880-BNPR) Cardiology Referral (Cardiology)  Problem # 2:  HYPOTHYROIDISM (ICD-244.9)  The following medications were removed from the medication list:    Synthroid 100 Mcg Tabs (Levothyroxine sodium) .Marland Kitchen... 1 by mouth once daily His updated medication list for this problem includes:    Synthroid 125 Mcg Tabs (Levothyroxine sodium) .Marland Kitchen... 1 once daily  Problem # 3:  HYPERTENSION (ICD-401.9)  The following medications were removed from the medication list:  Lopressor 100 Mg Tabs (Metoprolol tartrate) .Marland Kitchen... 1 by mouth two times a day    Hydrochlorothiazide 25 Mg Tabs (Hydrochlorothiazide) .Marland Kitchen... Take 2 tablet by mouth once a day    Catapres-tts-2 0.2 Mg/24hr Ptwk (Clonidine hcl) .Marland Kitchen... 1 by mouth two times a day    Amlodipine Besylate 10 Mg Tabs (Amlodipine besylate) .Marland Kitchen... 1 by mouth once  daily His updated medication list for this problem includes:    Ramipril 10 Mg Caps (Ramipril) .Marland Kitchen... 1 tablet by mouth daily    Metoprolol Tartrate 25 Mg Tabs (Metoprolol tartrate) .Marland KitchenMarland KitchenMarland KitchenMarland Kitchen 3 tabs two times a day    Catapres 0.2 Mg Tabs (Clonidine hcl) .Marland Kitchen... 1 once daily    Cozaar 25 Mg Tabs (Losartan potassium) ..... Once daily  Problem # 4:  PULMONARY EMBOLISM (ICD-415.19)  His updated medication list for this problem includes:    Coumadin 5 Mg Tabs (Warfarin sodium) .Marland KitchenMarland KitchenMarland KitchenMarland Kitchen 10 mg 3 days, 7.5 mg 4 days  Problem # 5:  ATRIAL FIBRILLATION, PAROXYSMAL (ICD-427.31)  The following medications were removed from the medication list:    Lopressor 100 Mg Tabs (Metoprolol tartrate) .Marland Kitchen... 1 by mouth two times a day    Amlodipine Besylate 10 Mg Tabs (Amlodipine besylate) .Marland Kitchen... 1 by mouth once daily His updated medication list for this problem includes:    Coumadin 5 Mg Tabs (Warfarin sodium) .Marland KitchenMarland KitchenMarland KitchenMarland Kitchen 10 mg 3 days, 7.5 mg 4 days    Metoprolol Tartrate 25 Mg Tabs (Metoprolol tartrate) .Marland KitchenMarland KitchenMarland KitchenMarland Kitchen 3 tabs two times a day  Orders: Protime (45409WJ) Cardiology Referral (Cardiology)  Problem # 6:  DIABETES MELLITUS, TYPE II (ICD-250.00)  His updated medication list for this problem includes:    Ramipril 10 Mg Caps (Ramipril) .Marland Kitchen... 1 tablet by mouth daily    Metformin Hcl 500 Mg Tabs (Metformin hcl) .Marland Kitchen... 1 once daily    Cozaar 25 Mg Tabs (Losartan potassium) ..... Once daily  Orders: Capillary Blood Glucose/CBG (19147) UA Dipstick w/o Micro (automated)  (81003) Venipuncture (82956) TLB-BMP (Basic Metabolic Panel-BMET) (80048-METABOL) TLB-CBC Platelet - w/Differential (85025-CBCD) TLB-Hepatic/Liver Function Pnl (80076-HEPATIC) TLB-TSH (Thyroid Stimulating Hormone) (84443-TSH) TLB-B12, Serum-Total ONLY (82607-B12) TLB-A1C / Hgb A1C (Glycohemoglobin) (83036-A1C)  Complete Medication List: 1)  Multivitamins Tabs (Multiple vitamin) .Marland Kitchen.. 1 by mouth once daily 2)  Klor-con M20 20 Meq Tbcr (Potassium chloride  crys cr) .Marland Kitchen.. 1 by mouth two times a day 3)  Coumadin 5 Mg Tabs (Warfarin sodium) .Marland Kitchen.. 10 mg 3 days, 7.5 mg 4 days 4)  Citrucel Powd (Methylcellulose (laxative)) .... Once daily 5)  Zoloft 25 Mg Tabs (Sertraline hcl) .... 3 once daily 6)  Ramipril 10 Mg Caps (Ramipril) .Marland Kitchen.. 1 tablet by mouth daily 7)  Metformin Hcl 500 Mg Tabs (Metformin hcl) .Marland Kitchen.. 1 once daily 8)  Omeprazole 20 Mg Cpdr (Omeprazole) .... One by mouth daily 9)  Pravastatin Sodium 40 Mg Tabs (Pravastatin sodium) .Marland Kitchen.. 1 by mouth once daily 10)  Metoprolol Tartrate 25 Mg Tabs (Metoprolol tartrate) .... 3 tabs two times a day 11)  Synthroid 125 Mcg Tabs (Levothyroxine sodium) .Marland Kitchen.. 1 once daily 12)  Catapres 0.2 Mg Tabs (Clonidine hcl) .Marland Kitchen.. 1 once daily 13)  Cozaar 25 Mg Tabs (Losartan potassium) .... Once daily  Patient Instructions: 1)  Add Losartan to keep the BP down. Check labs today. Refer to Cardiology  for further advice Prescriptions: METOPROLOL TARTRATE 25 MG TABS (METOPROLOL TARTRATE) 3 tabs two times a day  #180 x 11   Entered and Authorized by:   Nelwyn Salisbury MD  Signed by:   Nelwyn Salisbury MD on 09/06/2009   Method used:   Electronically to        [facility]  [phone]* (retail)       [address]       [place], [place]  [address]       Ph: [phone] or [phone]       Fax: [phone]   RxID:   [id number] COZAAR 25 MG TABS (LOSARTAN POTASSIUM) once daily  #30 x 11   Entered and Authorized by:   Nelwyn Salisbury MD   Signed by:   Nelwyn Salisbury MD on 09/06/2009   Method used:   Electronically to        [facility]  [phone]* (retail)       [address]       [place], [place]  [address]       Ph: [phone] or [phone]       Fax: [phone]   RxID:   [id number]   Laboratory Results   Blood Tests     CBG Random:: 97mg /dL PT: 42.7 s   (Normal Range: 10.6-13.4)  INR: 2.0   (Normal Range: 0.88-1.12   Therap INR:  2.0-3.5) Comments: Rita Ohara  September 06, 2009 3:31 PM       ANTICOAGULATION RECORD PREVIOUS REGIMEN & LAB RESULTS Anticoagulation Diagnosis:  V58.83,V58.61,415.19 on  02/16/2008 Previous INR Goal Range:  2.0-3.0 on  02/16/2008 Previous INR:  1.2 on  06/29/2008 Previous Coumadin Dose(mg):  10mg  qd on  04/16/2008 Previous Regimen:  10mg . Mon. Wed. Fri. 7.5mg . other days on  06/15/2008 Previous Coagulation Comments:  Missed 1 day on  04/16/2008  NEW REGIMEN & LAB RESULTS Current INR: 2.0 Regimen: same  Repeat testing in: 1 month  Anticoagulation Visit Questionnaire Coumadin dose missed/changed:  No Abnormal Bleeding Symptoms:  No  Any diet changes including alcohol intake, vegetables or greens since the last visit:  No Any illnesses or hospitalizations since the last visit:  No Any signs of clotting since the last visit (including chest discomfort, dizziness, shortness of breath, arm tingling, slurred speech, swelling or redness in leg):  No  MEDICATIONS MULTIVITAMINS   TABS (MULTIPLE VITAMIN) 1 by mouth once daily KLOR-CON M20 20 MEQ TBCR (POTASSIUM CHLORIDE CRYS CR) 1 by mouth two times a day COUMADIN 5 MG TABS (WARFARIN SODIUM) 10 mg 3 days, 7.5 mg 4 days CITRUCEL   POWD (METHYLCELLULOSE (LAXATIVE)) once daily ZOLOFT 25 MG  TABS (SERTRALINE HCL) 3 once daily RAMIPRIL 10 MG  CAPS (RAMIPRIL) 1 tablet by mouth daily METFORMIN HCL 500 MG TABS (METFORMIN HCL) 1 once daily OMEPRAZOLE 20 MG CPDR (OMEPRAZOLE) one by mouth daily PRAVASTATIN SODIUM 40 MG TABS (PRAVASTATIN SODIUM) 1 by mouth once daily METOPROLOL TARTRATE 25 MG TABS (METOPROLOL TARTRATE) 3 tabs two times a day SYNTHROID 125 MCG TABS (LEVOTHYROXINE SODIUM) 1 once daily CATAPRES 0.2 MG TABS (CLONIDINE HCL) 1 once daily COZAAR 25 MG TABS (LOSARTAN POTASSIUM) once daily

## 2010-07-18 NOTE — Progress Notes (Signed)
Summary: Nuclear Pre-Procedure  Phone Note Outgoing Call Call back at Home Phone [phone]   Call placed by: Stanton Kidney, EMT-P,  September 27, 2009 1:56 PM Call placed to: Patient Action Taken: Phone Call Completed Summary of Call: Reviewed information on Myoview Information Sheet (see scanned document for further details).  Spoke with Patient. Also, instructions left on the daughter's cell voicemail- Lori [phone].    Nuclear Med Background Indications for Stress Test: Evaluation for Ischemia   History: Echo  History Comments: 1/11 Echo: EF= 40-45%  Symptoms: Dizziness, Near Syncope    Nuclear Pre-Procedure Cardiac Risk Factors: CVA, Hypertension, Lipids, NIDDM Height (in): 74

## 2010-07-18 NOTE — Assessment & Plan Note (Signed)
Summary: Cardiology Nuclear Study  Nuclear Med Background Indications for Stress Test: Evaluation for Ischemia   History: Echo  History Comments: 1/11 Echo: EF= 40-45%  Symptoms: Dizziness, Near Syncope, Palpitations    Nuclear Pre-Procedure Cardiac Risk Factors: CVA, Hypertension, Lipids, NIDDM Caffeine/Decaff Intake: None NPO After: 6:00 PM Lungs: clear IV 0.9% NS with Angio Cath: 20g     IV Site: (R) AC IV Started by: Irean Hong RN Chest Size (in) 46     Height (in): 74 Weight (lb): 236 BMI: 30.41 Tech Comments: Last dose bisoprolol 2 PM yesterday. The patient has a  problem with balance, and walks slowly with a cane and assistance, changed to [facility]. Patsy Edwards,RN.  Nuclear Med Study 1 or 2 day study:  1 day     Stress Test Type:  Eugenie Birks Reading MD:  Charlton Haws, MD     Referring MD:  D.Mclean Resting Radionuclide:  Technetium 67m Tetrofosmin     Resting Radionuclide Dose:  10.9 mCi  Stress Radionuclide:  Technetium 54m Tetrofosmin     Stress Radionuclide Dose:  33 mCi   Stress Protocol   Lexiscan: 0.4 mg   Stress Test Technologist:  Milana Na EMT-P     Nuclear Technologist:  Domenic Polite CNMT  Rest Procedure  Myocardial perfusion imaging was performed at rest 45 minutes following the intravenous administration of Myoview Technetium 89m Tetrofosmin.  Stress Procedure  The patient received IV Lexiscan 0.4 mg over 15-seconds.  Myoview injected at 30-seconds.  There were no significant changes, no symptoms, and occ pvcs/pacs with infusion.  Quantitative spect images were obtained after a 45 minute delay.  QPS Raw Data Images:  Normal; no motion artifact; normal heart/lung ratio. Stress Images:  NI: Uniform and normal uptake of tracer in all myocardial segments. Rest Images:  Normal homogeneous uptake in all areas of the myocardium. Subtraction (SDS):  Normal Transient Ischemic Dilatation:  .97  (Normal <1.22)  Lung/Heart Ratio:  .41  (Normal  <0.45)  Quantitative Gated Spect Images QGS EDV:  131 ml QGS ESV:  50 ml QGS EF:  62 % QGS cine images:  normal  Findings Normal nuclear study      Overall Impression  Exercise Capacity: Lexiscan BP Response: Normal blood pressure response. Clinical Symptoms: No chest pain ECG Impression: No significant ST segment change suggestive of ischemia. Overall Impression: Normal stress nuclear study.  Appended Document: Cardiology Nuclear Study normal study.   Appended Document: Cardiology Nuclear Study discussed results with pt by telephone

## 2010-07-18 NOTE — Progress Notes (Signed)
Summary: B/P--BMP 11-03-09  Phone Note Outgoing Call   Call placed by: Katina Dung, RN, BSN,  Nov 03, 2009 8:45 AM Call placed to: Patient Summary of Call: B/P readings  Follow-up for Phone Call        10-20-09--Losartan stopped--ramipril increased to 20mg  daily--  pt here for echo and BMP  today--B/P checked by Marchelle Folks B/P 130/79 HR=59--will forward to Dr Shirlee Latch for review     Prescriptions: RAMIPRIL 10 MG CAPS (RAMIPRIL) two tablets daily  #180 x 3   Entered by:   Katina Dung, RN, BSN   Authorized by:   Marca Ancona, MD   Signed by:   Katina Dung, RN, BSN on 11/03/2009   Method used:   Electronically to        [facility]* (mail-order)             ,          Ph: [phone]       Fax: [phone]   RxID:   [id number]   Appended Document: B/P--BMP 11-03-09 BP is ok.

## 2010-07-18 NOTE — Assessment & Plan Note (Signed)
Summary: Jacob Navarro   Primary Provider:  Dr. Clent Ridges  CC:  Jacob week follow up.  History of Present Illness: 76 yo with history of DM, HTN, hyperlipidemia, PE, significant depression, and paroxysmal atrial fibrillation returns for evaluation of atrial fib and cardiomyopathy.  Patient admitted in 1/11 with a near-syncopal event and was found to have orthostatic hypotension.  He also was noted to be in atrial fibrillation.  It is unclear if this was thought to be the cause of orthostasis.  He spontaneously converted to NSR.  Also during this hospitalization, he was noted on echo to have EF 40-45% with diffuse global hypokinesis.    Currently, patient is somewhat unsteady on his feet.  He falls once every 2 wks or so.  He denies lightheadedness or syncope and says that he simply loses his balance and trips.  He does not use a cane or walker.  He will not take medications more than once a day and seems to be somewhat poorly compliant with his meds.  He took his meds today and BP is 130/90.   He lives by himself but his daughter checks in daily.  He is very sedentary, rarely leaving the house.  He does go shopping for groceries.  He denies shortness of breath carrying his groceries into the house.  He denies exertional dyspnea when walking but admits to walking very slowly.  He never climbs steps.  No chest pain.    He had a Lexiscan myoview in 4/11 showing EF 62% and normal perfusion (no ischemia or infarction).   Labs (Jacob/11): TSH normal, BNP 323 Labs (4/11): HDL 38, LDL 160, K Jacob.4, Na 146, creatinine 1.4  Current Medications (verified): 1)  Multivitamins   Tabs (Multiple Vitamin) .Marland Kitchen.. 1 By Mouth Once Daily 2)  Klor-Con M20 20 Meq Tbcr (Potassium Chloride Crys Cr) .... Take One Tablet Once Daily Jacob)  Coumadin 5 Mg Tabs (Warfarin Sodium) .Marland Kitchen.. 10 Mg Jacob Days, 7.5 Mg 4 Days 4)  Citrucel   Powd (Methylcellulose (Laxative)) .... Once Daily 5)  Zoloft 25 Mg  Tabs (Sertraline Hcl) .... Jacob Once Daily 6)   Ramipril 10 Mg Caps (Ramipril) .... Two Tablets Daily 7)  Metformin Hcl 500 Mg Tabs (Metformin Hcl) .Marland Kitchen.. 1 Once Daily 8)  Omeprazole 20 Mg Cpdr (Omeprazole) .... One By Mouth Daily 9)  Simvastatin 40 Mg Tabs (Simvastatin) .... One Tablet Daily 10)  Bisoprolol Fumarate 5 Mg Tabs (Bisoprolol Fumarate) .... One Tablet Daily 11)  Catapres 0.2 Mg Tabs (Clonidine Hcl) .Marland Kitchen.. 1 Once Daily 12)  Levothroid 125 Mcg Tabs (Levothyroxine Sodium) .... Take One Tablet Once Daily  Allergies (verified): 1)  ! Tylenol  Past History:  Past Medical History: 1. Depression 2. GERD Jacob. Hypertension 4. Hypothyroidism 5. PEs: on chronic coumadin.  6. Diabetes mellitus, type II 7. Paroxysmal atrial fibrillation: noted in 1/11.   8. CVA 9. Cardiomyopathy: Echo (1/11) showed mild LVH, EF 40-45% with diffuse global hypokinesis, grade I diastolic dysfunction, mild MR, mild AI, mild RV dilation with PA systolic pressure 32 mmHg.  Lexiscan myoview done in 4/11 showed EF 62% with no ischemia or infarction.   Family History: Reviewed history from 03/03/2008 and no changes required. Family History Diabetes 1st degree relative  Social History: Reviewed history from 09/26/2009 and no changes required. Lives alone, daughter checks in daily.  No smoking x 50 years No ETOH  Review of Systems       All systems reviewed and negative except as per HPI.  Vital Signs:  Patient profile:   76 year old male Height:      74 inches Weight:      239 pounds BMI:     30.80 Pulse rate:   56 / minute Pulse rhythm:   regular BP sitting:   130 / 90  (left arm) Cuff size:   large  Vitals Entered By: Judithe Modest CMA (Oct 20, 2009 2:11 PM)  Physical Exam  General:  Well developed, well nourished, in no acute distress. Flat affect.  Neck:  Neck supple, no JVD. No masses, thyromegaly or abnormal cervical nodes. Lungs:  Clear bilaterally to auscultation and percussion. Heart:  Non-displaced PMI, chest non-tender; regular  rate and rhythm, S1, S2 without murmurs, rubs or gallops. Carotid upstroke normal, no bruit.  Pedals normal pulses. 1+ ankle edema bilaterally.  Abdomen:  Bowel sounds positive; abdomen soft and non-tender without masses, organomegaly, or hernias noted. No hepatosplenomegaly. Msk:  Shuffling gait.  Extremities:  No clubbing or cyanosis. Neurologic:  Alert and oriented x Jacob. Psych:  depressed affect.     Impression & Recommendations:  Problem # 1:  CARDIOMYOPATHY (ICD-425.4) EF 40-45% with diffuse global hypokinesis on echo 1/11.  It is unclear if he was in atrial fibrillation during this study. Given the global hypokinesis, it is certainly possible that this is a nonischemic cardiomyopathy, possibly tachycardia-mediated.  After last appointment, I did a Lexiscan myoview to assess for evidence for coronary disease as a cause of the cardiomyopathy.  His myoview showed no perfusion defect, and EF was actually normal.  This makes a tachycardia-mediated cardiomyopathy more likely given the apparent recovery of systolic function in sinus rhythm.  I will repeat the echo to confirm normalization of EF.    Problem # 2:  HYPERLIPIDEMIA-MIXED (ICD-272.4) LDL was still very high on pravastatin 40.  I will had him stop this and start Zocor 40 mg daily.  Lipids/LFTs in 6/11.  Problem # Jacob:  ATRIAL FIBRILLATION, PAROXYSMAL (ICD-427.31) NSR today. He was in atrial fib back in 1/11 when he was hospitalized.  I do not see any other record of atrial fibrillation.  I will have him continue coumadin given high CHADS2 score.    Problem # 4:  HYPERTENSION (ICD-401.9) BP ok today.  I am going to simplify his regimen given difficulty with compliance by stopping losartan and increasing ramipril to 20 mg daily.  BMET and BP check in 2 wks.   Other Orders: Echocardiogram (Echo)  Patient Instructions: 1)  Your physician has recommended you make the following change in your medication:  2)  Stop Losartan Jacob)  Increase  Ramipril to 20mg  daily--this will be two 10mg  tablets 4)  Your physician recommends that you return for lab work in: 2 weeks BMP 427.31 425.4 5)  Blood pressure check with the nurse in 2 weeks. 6)  Your physician has requested that you have an echocardiogram.  Echocardiography is a painless test that uses sound waves to create images of your heart. It provides your doctor with information about the size and shape of your heart and how well your heart's chambers and valves are working.  This procedure takes approximately one hour. There are no restrictions for this procedure. IN 2 WEEKS 7)  Your physician wants you to follow-up in: ONE YEAR with Dr Shirlee Latch.   You will receive a reminder letter in the mail two months in advance. If you don't receive a letter, please call our office to schedule the follow-up appointment.

## 2010-07-18 NOTE — Assessment & Plan Note (Signed)
Summary: PT/NJR  Nurse Visit   Allergies: 1)  ! Tylenol Laboratory Results   Blood Tests      INR: 4.1   (Normal Range: 0.88-1.12   Therap INR: 2.0-3.5) Comments: Rita Ohara  March 08, 2010 11:55 AM     Orders Added: 1)  Est. Patient Level I [99211] 2)  Protime [[id number]]   ANTICOAGULATION RECORD PREVIOUS REGIMEN & LAB RESULTS Anticoagulation Diagnosis:  V58.83,V58.61,415.19 on  02/16/2008 Previous INR Goal Range:  2.5-3.5 on  01/11/2010 Previous INR:  3.2 on  02/08/2010 Previous Coumadin Dose(mg):  10mg  on tue,thu 7.5mg  on other days on  11/15/2009 Previous Regimen:  same on  02/08/2010 Previous Coagulation Comments:  Missed 1 day on  04/16/2008  NEW REGIMEN & LAB RESULTS Current INR: 4.1 Regimen: 10mg . Tues and Sun.  Repeat testing in: 2 weeks  Anticoagulation Visit Questionnaire Coumadin dose missed/changed:  No Abnormal Bleeding Symptoms:  No  Any diet changes including alcohol intake, vegetables or greens since the last visit:  No Any illnesses or hospitalizations since the last visit:  No Any signs of clotting since the last visit (including chest discomfort, dizziness, shortness of breath, arm tingling, slurred speech, swelling or redness in leg):  No  MEDICATIONS MULTIVITAMINS   TABS (MULTIPLE VITAMIN) 1 by mouth once daily KLOR-CON M20 20 MEQ TBCR (POTASSIUM CHLORIDE CRYS CR) take one tablet once daily COUMADIN 5 MG TABS (WARFARIN SODIUM) as directed CITRUCEL   POWD (METHYLCELLULOSE (LAXATIVE)) once daily ZOLOFT 25 MG  TABS (SERTRALINE HCL) 3 once daily RAMIPRIL 10 MG CAPS (RAMIPRIL) two tablets daily METFORMIN HCL 500 MG TABS (METFORMIN HCL) 1 once daily OMEPRAZOLE 20 MG CPDR (OMEPRAZOLE) one by mouth daily SIMVASTATIN 40 MG TABS (SIMVASTATIN) one tablet daily BISOPROLOL FUMARATE 5 MG TABS (BISOPROLOL FUMARATE) one tablet daily CATAPRES 0.2 MG TABS (CLONIDINE HCL) 1 once daily LEVOTHROID 125 MCG TABS (LEVOTHYROXINE SODIUM) take one tablet  once daily

## 2010-07-18 NOTE — Assessment & Plan Note (Signed)
Summary: pt/njr  Nurse Visit   Allergies: 1)  ! Tylenol Laboratory Results   Blood Tests     PT: 22.7 s   (Normal Range: 10.6-13.4)  INR: 3.5   (Normal Range: 0.88-1.12   Therap INR: 2.0-3.5) Comments: Rita Ohara  October 04, 2009 2:05 PM     Orders Added: 1)  Est. Patient Level I [99211] 2)  Protime [[id number]]   ANTICOAGULATION RECORD PREVIOUS REGIMEN & LAB RESULTS Anticoagulation Diagnosis:  V58.83,V58.61,415.19 on  02/16/2008 Previous INR Goal Range:  2.0-3.0 on  02/16/2008 Previous INR:  2.0 on  09/06/2009 Previous Coumadin Dose(mg):  10mg  qd on  04/16/2008 Previous Regimen:  same on  09/06/2009 Previous Coagulation Comments:  Missed 1 day on  04/16/2008  NEW REGIMEN & LAB RESULTS Current INR: 3.5 Regimen: same  Repeat testing in: 1 month  Anticoagulation Visit Questionnaire Coumadin dose missed/changed:  No Abnormal Bleeding Symptoms:  No  Any diet changes including alcohol intake, vegetables or greens since the last visit:  No Any illnesses or hospitalizations since the last visit:  No Any signs of clotting since the last visit (including chest discomfort, dizziness, shortness of breath, arm tingling, slurred speech, swelling or redness in leg):  No  MEDICATIONS MULTIVITAMINS   TABS (MULTIPLE VITAMIN) 1 by mouth once daily KLOR-CON M20 20 MEQ TBCR (POTASSIUM CHLORIDE CRYS CR) 1 by mouth once daily COUMADIN 5 MG TABS (WARFARIN SODIUM) 10 mg 3 days, 7.5 mg 4 days CITRUCEL   POWD (METHYLCELLULOSE (LAXATIVE)) once daily ZOLOFT 25 MG  TABS (SERTRALINE HCL) 3 once daily RAMIPRIL 10 MG  CAPS (RAMIPRIL) 1 tablet by mouth daily METFORMIN HCL 500 MG TABS (METFORMIN HCL) 1 once daily OMEPRAZOLE 20 MG CPDR (OMEPRAZOLE) one by mouth daily SIMVASTATIN 40 MG TABS (SIMVASTATIN) one tablet daily BISOPROLOL FUMARATE 5 MG TABS (BISOPROLOL FUMARATE) one tablet daily CATAPRES 0.2 MG TABS (CLONIDINE HCL) 1 once daily COZAAR 25 MG TABS (LOSARTAN POTASSIUM) once daily

## 2010-07-18 NOTE — Progress Notes (Signed)
Summary: rx refill  Phone Note From Pharmacy   Summary of Call: patient is requesting a refill of metoprolol 25mg  three tabs two times a day  Initial call taken by: Kern Reap CMA Duncan Dull),  August 24, 2009 4:25 PM  Follow-up for Phone Call        this rx is not on the medication list.  the medication has been changed by the hospital.  okay for a 30 day and patient needs to come in for an office visit per dr Rutilio Yellowhair. Follow-up by: Kern Reap CMA Duncan Dull),  August 24, 2009 4:23 PM    New/Updated Medications: METOPROLOL TARTRATE 25 MG TABS (METOPROLOL TARTRATE) take one tab three times a day two times a day Prescriptions: METOPROLOL TARTRATE 25 MG TABS (METOPROLOL TARTRATE) take one tab three times a day two times a day  #180 x 0   Entered by:   Kern Reap CMA (AAMA)   Authorized by:   Nelwyn Salisbury MD   Signed by:   Kern Reap CMA (AAMA) on 08/24/2009   Method used:   Electronically to        [facility]  [phone]* (retail)       [address]       [place], [place]  [address]       Ph: [phone] or [phone]       Fax: [phone]   RxID:   [id number]   Appended Document: rx refill    Prescriptions: METOPROLOL TARTRATE 25 MG TABS (METOPROLOL TARTRATE) 3 tabs two times a day  needs ov  #180 x 0   Entered by:   Alfred Levins, CMA   Authorized by:   Nelwyn Salisbury MD   Signed by:   Alfred Levins, CMA on 08/26/2009   Method used:   Electronically to        [facility]  [phone]* (retail)       [address]       [place], [place]  [address]       Ph: [phone] or [phone]       Fax: [phone]   RxID:   [id number]

## 2010-07-18 NOTE — Progress Notes (Signed)
Summary: Patient has question re: potassium  Phone Note Call from Patient Call back at [phone]   Caller: Daughter Summary of Call: Patient's daughter states her father has been taken off Potassium by a previous physician.  She wants to know if you want her to restart the Potassium. - She said you called her and told her to give it to him BID. Initial call taken by: Everrett Coombe,  September 07, 2009 11:53 AM  Follow-up for Phone Call        Phone Call Completed Follow-up by: Raechel Ache, RN,  September 07, 2009 12:09 PM

## 2010-07-18 NOTE — Progress Notes (Signed)
Summary: discharge hh  Phone Note From Other Clinic   Caller: [facility] Call For: Dr. Clent Ridges Summary of Call: Home Health states he is not compliant with meds and exercise.  He is incontinent, and is not interested in keeping clean.  Ready to discharge.  Prob needs Engineer, site.  Refuses OT and PT. Needs discharge order. Beth [phone] Initial call taken by: Lynann Beaver CMA,  February 14, 2010 1:10 PM  Follow-up for Phone Call        please give them a DC order from home health Follow-up by: Nelwyn Salisbury MD,  February 14, 2010 3:27 PM  Additional Follow-up for Phone Call Additional follow up Details #1::        No voice mail on this phone.  Called again.  No voice mail.  Called [facility] [phone], Diane, and gave discharge order. Rudy Jew, RN  February 15, 2010 1:45 PM    Additional Follow-up by: Lynann Beaver CMA,  February 14, 2010 3:30 PM

## 2010-07-18 NOTE — Assessment & Plan Note (Signed)
Summary: np6/CHF/PAT/jml   Primary Provider:  Dr. Clent Ridges  CC:  NEW PATIENT/CHF/PAT/pt denies any symptoms whatsoever.  Daughter reports pt does not want to take his medications and often forgets unless she calls him.  He will not take his bid dosages on his medications.  History of Present Illness: 76 yo with history of DM, HTN, hyperlipidemia, PE, significant depression, and paroxysmal atrial fibrillation presents for evaluation of atrial fib and cardiomyopathy.  Patient admitted in 1/11 with a near-syncopal event and was found to have orthostatic hypotension.  He also was noted to be in atrial fibrillation.  It is unclear if this was thought to be the cause of orthostasis.  He spontaneously converted to NSR.  Also during this hospitalization, he was noted on echo to have EF 40-45% with diffuse global hypokinesis.    Currently, patient is somewhat unsteady on his feet.  He falls once every 2 wks or so.  He denies lightheadedness or syncope and says that he simply loses his balance and trips.  He does not use a cane or walker.  He will not take medications more than once a day and seems to be somewhat poorly compliant with his meds.  He did not take anything this morning and BP is 162/110 (he is supposed to take several BP meds). He lives by himself but his daughter checks in daily.  He is very sedentary, rarely leaving the house.  He does go shopping for groceries.  He denies shortness of breath carrying his groceries into the house.  He denies exertional dyspnea when walking but admits to walking very slowly.  He never climbs steps.  No chest pain.    ECG: NSR at 56, LAFB, otherwise normal  Labs (3/11): TSH normal, BNP 323 Labs (4/11): HDL 38, LDL 160, K 3.4, Na 146, creatinine 1.4  Current Medications (verified): 1)  Multivitamins   Tabs (Multiple Vitamin) .Marland Kitchen.. 1 By Mouth Once Daily 2)  Klor-Con M20 20 Meq Tbcr (Potassium Chloride Crys Cr) .Marland Kitchen.. 1 By Mouth Once Daily 3)  Coumadin 5 Mg Tabs  (Warfarin Sodium) .Marland Kitchen.. 10 Mg 3 Days, 7.5 Mg 4 Days 4)  Citrucel   Powd (Methylcellulose (Laxative)) .... Once Daily 5)  Zoloft 25 Mg  Tabs (Sertraline Hcl) .... 3 Once Daily 6)  Ramipril 10 Mg  Caps (Ramipril) .Marland Kitchen.. 1 Tablet By Mouth Daily 7)  Metformin Hcl 500 Mg Tabs (Metformin Hcl) .Marland Kitchen.. 1 Once Daily 8)  Omeprazole 20 Mg Cpdr (Omeprazole) .... One By Mouth Daily 9)  Pravastatin Sodium 40 Mg Tabs (Pravastatin Sodium) .Marland Kitchen.. 1 By Mouth Once Daily 10)  Metoprolol Tartrate 25 Mg Tabs (Metoprolol Tartrate) .... 3 Tablets Once Daily 11)  Catapres 0.2 Mg Tabs (Clonidine Hcl) .Marland Kitchen.. 1 Once Daily 12)  Cozaar 25 Mg Tabs (Losartan Potassium) .... Once Daily  Allergies (verified): 1)  ! Tylenol  Past History:  Past Medical History: 1. Depression 2. GERD 3. Hypertension 4. Hypothyroidism 5. PEs: on chronic coumadin.  6. Diabetes mellitus, type II 7. Paroxysmal atrial fibrillation: noted in 1/11.   8. CVA 9. Cardiomyopathy: Echo (1/11) showed mild LVH, EF 40-45% with diffuse global hypokinesis, grade I diastolic dysfunction, mild MR, mild AI, mild RV dilation with PA systolic pressure 32 mmHg.   Family History: Reviewed history from 03/03/2008 and no changes required. Family History Diabetes 1st degree relative  Social History: Lives alone, daughter checks in daily.  No smoking x 50 years No ETOH  Review of Systems  All systems reviewed and negative except as per HPI.   Vital Signs:  Patient profile:   76 year old male Height:      74 inches Weight:      238 pounds BMI:     30.67 Pulse rate:   56 / minute Pulse rhythm:   regular BP sitting:   162 / 110  (left arm) Cuff size:   large  Vitals Entered By: Judithe Modest CMA (September 26, 2009 12:01 PM)  Physical Exam  General:  Well developed, well nourished, in no acute distress. Flat affect.  Head:  normocephalic and atraumatic Nose:  no deformity, discharge, inflammation, or lesions Mouth:  Teeth, gums and palate normal.  Oral mucosa normal. Neck:  Neck supple, no JVD. No masses, thyromegaly or abnormal cervical nodes. Lungs:  Clear bilaterally to auscultation and percussion. Heart:  Non-displaced PMI, chest non-tender; regular rate and rhythm, S1, S2 without murmurs, rubs or gallops. Carotid upstroke normal, no bruit.  Pedals normal pulses. 1+ ankle edema bilaterally.  Abdomen:  Bowel sounds positive; abdomen soft and non-tender without masses, organomegaly, or hernias noted. No hepatosplenomegaly. Msk:  Back normal, normal gait. Muscle strength and tone normal. Extremities:  No clubbing or cyanosis. Neurologic:  Alert and oriented x 3. Skin:  Intact without lesions or rashes. Psych:  depressed affect.     Impression & Recommendations:  Problem # 1:  CARDIOMYOPATHY (ICD-425.4) EF 40-45% with diffuse global hypokinesis on echo 1/11.  It is unclear if he was in atrial fibrillation during this study. Given the global hypokinesis, it is certainly possible that this is a nonischemic cardiomyopathy, possibly tachycardia-mediated.  However, he has a number of risk factors for CAD and this alternatively could be an ischemic cardiomyopathy.  Given lack of significant symptoms (though he is extremely sedentary), I will hold off on left heart cath at this time to definitively rule out CAD as this would require taking him off coumadin and he has had recurrent PEs as well as a prior CVA (? if this could be related to atrial fibrillation).  Instead, I will set him up for an ETT-myoview.  He may not be able to walk enough to appropriately elevate heart rate, so in that case will change to a [employer]  If myoview suggests CAD, he will need to start ASA.  I will also have him stop taking once daily metoprolol tartrate.  I will replace this with bisoprolol 5 mg daily (has mortality benefit in systolic CHF).  He will continue current dose of ramipril for now.  Patient appears only minimally volume overloaded and is not  particularly short of breath, so I am not going to start him on Lasix at this time.   Problem # 2:  ATRIAL FIBRILLATION, PAROXYSMAL (ICD-427.31) NSR today. He was in atrial fib back in 1/11 when he was hospitalized.  I do not see any other record of atrial fibrillation.  I will have him continue coumadin given high CHADS2 score.    Problem # 3:  HYPERTENSION (ICD-401.9) BP is up today but he has not taken any medications. He does not seem very compliant with his medication regimen.  As above, I will stop the once daily metoprolol tartrate and start bisoprolol.  When I see him again, I will likely increase his ramipril and stop his losartan (think that he probably does not need to be on both).   Problem # 4:  PULMONARY EMBOLISM, HX OF (ICD-V12.51) Continues on coumadin. He has fallen  due to balance difficulty.  I strongly encouraged him to use a cane.   Problem # 5:  HYPERLIPIDEMIA-MIXED (ICD-272.4) LDL is still very high on pravastatin 40.  I will have him stop this and start Zocor 40 mg daily.  Lipids/LFTs in 2 months.  Problem # 6:  DEPRESSION (ICD-311) Patient does seem significantly depressed.  I suspect this will inhibit his ability to comply with meds, etc.  I have discussed everything at length with his daughter who accompanies him.   Other Orders: Nuclear Stress Test (Nuc Stress Test) TLB-Lipid Panel (80061-LIPID) TLB-Hepatic/Liver Function Pnl (80076-HEPATIC) TLB-BMP (Basic Metabolic Panel-BMET) (80048-METABOL)  Patient Instructions: 1)  Your physician has recommended you make the following change in your medication:  2)  Stop Metoprolol  3)  Start Bisoprolol 5mg  daily 4)  Be sure to take your medication regularly. 5)  Be sure to use a cane when you walk. 6)  Your physician recommends that you have a  FASTING lipid profile/liver profile/BMP today 427.31 401.9 425.4 7)  Your physician has requested that you have an exercise stress myoview.  For further information please visit  [email or web address]  Please follow instruction sheet, as given. 8)  Your physician recommends that you schedule a follow-up appointment in: 3 weeks with Dr Shirlee Latch. Prescriptions: BISOPROLOL FUMARATE 5 MG TABS (BISOPROLOL FUMARATE) one tablet daily  #30 x 6   Entered by:   Katina Dung, RN, BSN   Authorized by:   Marca Ancona, MD   Signed by:   Katina Dung, RN, BSN on 09/26/2009   Method used:   Electronically to        [facility]  [phone]* (retail)       [address]       [place], [place]  [address]       Ph: [phone] or [phone]       Fax: [phone]   RxID:   [id number]

## 2010-07-18 NOTE — Miscellaneous (Signed)
Summary: Care Plans/[facility]  Care Plans/[facility]   Imported By: Sherian Rein 02/22/2010 12:27:31  _____________________________________________________________________  External Attachment:    Type:   Image     Comment:   External Document

## 2010-07-18 NOTE — Assessment & Plan Note (Signed)
Summary: PT//CCM  Nurse Visit   Allergies: 1)  ! Tylenol Laboratory Results   Blood Tests   Date/Time Received: January 11, 2010 11:15 AM  Date/Time Reported: January 11, 2010 11:15 AM    INR: 3.6   (Normal Range: 0.88-1.12   Therap INR: 2.0-3.5) Comments: Wynona Canes, CMA  January 11, 2010 11:15 AM     Orders Added: 1)  Est. Patient Level I [99211] 2)  Protime [13086VH]  Laboratory Results   Blood Tests      INR: 3.6   (Normal Range: 0.88-1.12   Therap INR: 2.0-3.5) Comments: Wynona Canes, CMA  January 11, 2010 11:15 AM       ANTICOAGULATION RECORD PREVIOUS REGIMEN & LAB RESULTS Anticoagulation Diagnosis:  V58.83,V58.61,415.19 on  02/16/2008 Previous INR Goal Range:  2.0-3.0 on  02/16/2008 Previous INR:  3.8 on  12/13/2009 Previous Coumadin Dose(mg):  10mg  on tue,thu 7.5mg  on other days on  11/15/2009 Previous Regimen:  same on  10/04/2009 Previous Coagulation Comments:  Missed 1 day on  04/16/2008  NEW REGIMEN & LAB RESULTS Current INR Goal Range: 2.5-3.5 Current INR: 3.6 Regimen: same  (no change)       Repeat testing in: 4 weeks MEDICATIONS MULTIVITAMINS   TABS (MULTIPLE VITAMIN) 1 by mouth once daily KLOR-CON M20 20 MEQ TBCR (POTASSIUM CHLORIDE CRYS CR) take one tablet once daily COUMADIN 5 MG TABS (WARFARIN SODIUM) 10 mg 3 days, 7.5 mg 4 days CITRUCEL   POWD (METHYLCELLULOSE (LAXATIVE)) once daily ZOLOFT 25 MG  TABS (SERTRALINE HCL) 3 once daily RAMIPRIL 10 MG CAPS (RAMIPRIL) two tablets daily METFORMIN HCL 500 MG TABS (METFORMIN HCL) 1 once daily OMEPRAZOLE 20 MG CPDR (OMEPRAZOLE) one by mouth daily SIMVASTATIN 40 MG TABS (SIMVASTATIN) one tablet daily BISOPROLOL FUMARATE 5 MG TABS (BISOPROLOL FUMARATE) one tablet daily CATAPRES 0.2 MG TABS (CLONIDINE HCL) 1 once daily LEVOTHROID 125 MCG TABS (LEVOTHYROXINE SODIUM) take one tablet once daily   Anticoagulation Visit Questionnaire      Coumadin dose missed/changed:  No      Abnormal Bleeding  Symptoms:  No   Any diet changes including alcohol intake, vegetables or greens since the last visit:  No Any illnesses or hospitalizations since the last visit:  No Any signs of clotting since the last visit (including chest discomfort, dizziness, shortness of breath, arm tingling, slurred speech, swelling or redness in leg):  No

## 2010-07-18 NOTE — Assessment & Plan Note (Signed)
Summary: pt/flu shot/njr  Nurse Visit   Vital Signs:  Patient profile:   76 year old male Temp:     98.2 degrees F oral  Vitals Entered By: Duard Brady LPN (March 22, 2010 12:44 PM)   Allergies: 1)  ! Tylenol Laboratory Results   Blood Tests   Date/Time Received: March 22, 2010 12:41 PM  Date/Time Reported: March 22, 2010 12:41 PM    INR: 2.6   (Normal Range: 0.88-1.12   Therap INR: 2.0-3.5) Comments: Wynona Canes, CMA  March 22, 2010 12:41 PM     Immunizations Administered:  Influenza Vaccine # 1:    Vaccine Type: Fluvax MCR    Site: left deltoid    Mfr: GlaxoSmithKline    Dose: 0.5 ml    Route: IM    Given by: Duard Brady LPN    Exp. Date: 12/16/2010    Lot #: [id number]    VIS given: 01/10/10 version given March 22, 2010.    Physician counseled: yes  Flu Vaccine Consent Questions:    Do you have a history of severe allergic reactions to this vaccine? no    Any prior history of allergic reactions to egg and/or gelatin? no    Do you have a sensitivity to the preservative Thimersol? no    Do you have a past history of Guillan-Barre Syndrome? no    Do you currently have an acute febrile illness? no    Have you ever had a severe reaction to latex? no    Vaccine information given and explained to patient? yes  Orders Added: 1)  Est. Patient Level I [99211] 2)  Protime [02725DG] 3)  Influenza Vaccine MCR [00025]  Laboratory Results   Blood Tests      INR: 2.6   (Normal Range: 0.88-1.12   Therap INR: 2.0-3.5) Comments: Wynona Canes, CMA  March 22, 2010 12:41 PM       ANTICOAGULATION RECORD PREVIOUS REGIMEN & LAB RESULTS Anticoagulation Diagnosis:  V58.83,V58.61,415.19 on  02/16/2008 Previous INR Goal Range:  2.5-3.5 on  01/11/2010 Previous INR:  4.1 on  03/08/2010 Previous Coumadin Dose(mg):  10mg  on tue,thu 7.5mg  on other days on  11/15/2009 Previous Regimen:  10mg . Tues and Sun. on  03/08/2010 Previous  Coagulation Comments:  Missed 1 day on  04/16/2008  NEW REGIMEN & LAB RESULTS Current INR: 2.6 Regimen: Same Dose       Repeat testing in: 4 weeks MEDICATIONS MULTIVITAMINS   TABS (MULTIPLE VITAMIN) 1 by mouth once daily KLOR-CON M20 20 MEQ TBCR (POTASSIUM CHLORIDE CRYS CR) take one tablet once daily COUMADIN 5 MG TABS (WARFARIN SODIUM) as directed CITRUCEL   POWD (METHYLCELLULOSE (LAXATIVE)) once daily ZOLOFT 25 MG  TABS (SERTRALINE HCL) 3 once daily RAMIPRIL 10 MG CAPS (RAMIPRIL) two tablets daily METFORMIN HCL 500 MG TABS (METFORMIN HCL) 1 once daily OMEPRAZOLE 20 MG CPDR (OMEPRAZOLE) one by mouth daily SIMVASTATIN 40 MG TABS (SIMVASTATIN) one tablet daily BISOPROLOL FUMARATE 5 MG TABS (BISOPROLOL FUMARATE) one tablet daily CATAPRES 0.2 MG TABS (CLONIDINE HCL) 1 once daily LEVOTHROID 125 MCG TABS (LEVOTHYROXINE SODIUM) take one tablet once daily   Anticoagulation Visit Questionnaire      Coumadin dose missed/changed:  No      Abnormal Bleeding Symptoms:  No   Any diet changes including alcohol intake, vegetables or greens since the last visit:  No Any illnesses or hospitalizations since the last visit:  No Any signs of clotting since the  last visit (including chest discomfort, dizziness, shortness of breath, arm tingling, slurred speech, swelling or redness in leg):  No

## 2010-07-18 NOTE — Progress Notes (Signed)
Summary: Order for Home Health  Phone Note Call from Patient   Caller: Lamar Laundry RN [facility]  [phone] Summary of Call: Pt was d/c from hospital on the 23rd.  Would like order for home health aid to assist with bathing. Initial call taken by: Trixie Dredge,  May 15, 2010 10:40 AM  Follow-up for Phone Call        please set this up  Follow-up by: Nelwyn Salisbury MD,  May 15, 2010 1:46 PM  Additional Follow-up for Phone Call Additional follow up Details #1::        referral to terri.  Additional Follow-up by: Pura Spice, RN,  May 16, 2010 9:51 AM

## 2010-07-18 NOTE — Progress Notes (Signed)
Summary: Refill Synthroid  Phone Note Refill Request Message from:  Pharmacy on November 16, 2009 3:38 PM  Refills Requested: Medication #1:  LEVOTHROID 125 MCG TABS take one tablet once daily. Initial call taken by: Kathrynn Speed CMA,  November 16, 2009 3:38 PM    Prescriptions: LEVOTHROID 125 MCG TABS (LEVOTHYROXINE SODIUM) take one tablet once daily  #30 x 3   Entered by:   Kathrynn Speed CMA   Authorized by:   Nelwyn Salisbury MD   Signed by:   Kathrynn Speed CMA on 11/16/2009   Method used:   Electronically to        [facility]  [phone]* (retail)       [address]       [place], [place]  [address]       Ph: [phone] or [phone]       Fax: [phone]   RxID:   [id number]

## 2010-07-18 NOTE — Assessment & Plan Note (Signed)
Summary: pt/njr  Nurse Visit   Allergies: 1)  ! Tylenol Laboratory Results   Blood Tests   Date/Time Received: April 19, 2010 3:17 PM  Date/Time Reported: April 19, 2010 3:17 PM    INR: 3.1   (Normal Range: 0.88-1.12   Therap INR: 2.0-3.5) Comments: Wynona Canes, CMA  April 19, 2010 3:17 PM     Orders Added: 1)  Est. Patient Level I [99211] 2)  Protime [78469GE]  Laboratory Results   Blood Tests      INR: 3.1   (Normal Range: 0.88-1.12   Therap INR: 2.0-3.5) Comments: Wynona Canes, CMA  April 19, 2010 3:17 PM       ANTICOAGULATION RECORD PREVIOUS REGIMEN & LAB RESULTS Anticoagulation Diagnosis:  V58.83,V58.61,415.19 on  02/16/2008 Previous INR Goal Range:  2.5-3.5 on  01/11/2010 Previous INR:  2.6 on  03/22/2010 Previous Coumadin Dose(mg):  10mg  on tue,thu 7.5mg  on other days on  11/15/2009 Previous Regimen:  Same Dose on  03/22/2010 Previous Coagulation Comments:  Missed 1 day on  04/16/2008  NEW REGIMEN & LAB RESULTS Current INR: 3.1 Regimen: Same Dose  (no change)       Repeat testing in: 4 weeks MEDICATIONS MULTIVITAMINS   TABS (MULTIPLE VITAMIN) 1 by mouth once daily KLOR-CON M20 20 MEQ TBCR (POTASSIUM CHLORIDE CRYS CR) take one tablet once daily COUMADIN 5 MG TABS (WARFARIN SODIUM) as directed CITRUCEL   POWD (METHYLCELLULOSE (LAXATIVE)) once daily ZOLOFT 25 MG  TABS (SERTRALINE HCL) 3 once daily RAMIPRIL 10 MG CAPS (RAMIPRIL) two tablets daily METFORMIN HCL 500 MG TABS (METFORMIN HCL) 1 once daily OMEPRAZOLE 20 MG CPDR (OMEPRAZOLE) one by mouth daily SIMVASTATIN 40 MG TABS (SIMVASTATIN) one tablet daily BISOPROLOL FUMARATE 5 MG TABS (BISOPROLOL FUMARATE) one tablet daily CATAPRES 0.2 MG TABS (CLONIDINE HCL) 1 once daily LEVOTHROID 125 MCG TABS (LEVOTHYROXINE SODIUM) take one tablet once daily   Anticoagulation Visit Questionnaire      Coumadin dose missed/changed:  No      Abnormal Bleeding Symptoms:  No   Any diet  changes including alcohol intake, vegetables or greens since the last visit:  No Any illnesses or hospitalizations since the last visit:  No Any signs of clotting since the last visit (including chest discomfort, dizziness, shortness of breath, arm tingling, slurred speech, swelling or redness in leg):  No

## 2010-07-18 NOTE — Assessment & Plan Note (Signed)
Summary: pt/cjr  Nurse Visit   Allergies: 1)  ! Tylenol Laboratory Results   Blood Tests   Date/Time Received: December 13, 2009 11:44 AM  Date/Time Reported: December 13, 2009 11:44 AM    INR: 3.8   (Normal Range: 0.88-1.12   Therap INR: 2.0-3.5) Comments: Wynona Canes, CMA  December 13, 2009 11:44 AM     Orders Added: 1)  Est. Patient Level I [99211] 2)  Protime [66294TM]  Laboratory Results   Blood Tests      INR: 3.8   (Normal Range: 0.88-1.12   Therap INR: 2.0-3.5) Comments: Wynona Canes, CMA  December 13, 2009 11:44 AM       ANTICOAGULATION RECORD PREVIOUS REGIMEN & LAB RESULTS Anticoagulation Diagnosis:  V58.83,V58.61,415.19 on  02/16/2008 Previous INR Goal Range:  2.0-3.0 on  02/16/2008 Previous INR:  1.7 on  11/15/2009 Previous Coumadin Dose(mg):  10mg  on tue,thu 7.5mg  on other days on  11/15/2009 Previous Regimen:  same on  10/04/2009 Previous Coagulation Comments:  Missed 1 day on  04/16/2008  NEW REGIMEN & LAB RESULTS Current INR: 3.8 Regimen: same  (no change)       Repeat testing in: 4 weeks MEDICATIONS MULTIVITAMINS   TABS (MULTIPLE VITAMIN) 1 by mouth once daily KLOR-CON M20 20 MEQ TBCR (POTASSIUM CHLORIDE CRYS CR) take one tablet once daily COUMADIN 5 MG TABS (WARFARIN SODIUM) 10 mg 3 days, 7.5 mg 4 days CITRUCEL   POWD (METHYLCELLULOSE (LAXATIVE)) once daily ZOLOFT 25 MG  TABS (SERTRALINE HCL) 3 once daily RAMIPRIL 10 MG CAPS (RAMIPRIL) two tablets daily METFORMIN HCL 500 MG TABS (METFORMIN HCL) 1 once daily OMEPRAZOLE 20 MG CPDR (OMEPRAZOLE) one by mouth daily SIMVASTATIN 40 MG TABS (SIMVASTATIN) one tablet daily BISOPROLOL FUMARATE 5 MG TABS (BISOPROLOL FUMARATE) one tablet daily CATAPRES 0.2 MG TABS (CLONIDINE HCL) 1 once daily LEVOTHROID 125 MCG TABS (LEVOTHYROXINE SODIUM) take one tablet once daily   Anticoagulation Visit Questionnaire      Coumadin dose missed/changed:  No      Abnormal Bleeding Symptoms:  No   Any diet changes  including alcohol intake, vegetables or greens since the last visit:  No Any illnesses or hospitalizations since the last visit:  No Any signs of clotting since the last visit (including chest discomfort, dizziness, shortness of breath, arm tingling, slurred speech, swelling or redness in leg):  No

## 2010-07-18 NOTE — Assessment & Plan Note (Signed)
Summary: fu on med/protime/njr   Vital Signs:  Patient profile:   76 year old male Weight:      235 pounds BMI:     30.28 Pulse rate:   80 / minute Pulse rhythm:   regular BP sitting:   148 / 90  (left arm) Cuff size:   regular  Vitals Entered By: Raechel Ache, RN (February 08, 2010 10:54 AM) CC: Hosp f/u.   History of Present Illness: Here with his daughter to follow up a hospital stay from 01-26-10 to 01-27-10 after a fall at home. His legs buckled beneath him, and he fell. There was no LOC and no apparent injuries. All test were negative, and his cardiac status was stable. he has been back home since then, and he gets PT 3 days a week. He has no complaints today but he needs refills.   Allergies: 1)  ! Tylenol  Past History:  Past Medical History: Reviewed history from 10/20/2009 and no changes required. 1. Depression 2. GERD 3. Hypertension 4. Hypothyroidism 5. PEs: on chronic coumadin.  6. Diabetes mellitus, type II 7. Paroxysmal atrial fibrillation: noted in 1/11.   8. CVA 9. Cardiomyopathy: Echo (1/11) showed mild LVH, EF 40-45% with diffuse global hypokinesis, grade I diastolic dysfunction, mild MR, mild AI, mild RV dilation with PA systolic pressure 32 mmHg.  Lexiscan myoview done in 4/11 showed EF 62% with no ischemia or infarction.   Review of Systems  The patient denies anorexia, fever, weight loss, weight gain, vision loss, decreased hearing, hoarseness, chest pain, syncope, dyspnea on exertion, peripheral edema, prolonged cough, headaches, hemoptysis, abdominal pain, melena, hematochezia, severe indigestion/heartburn, hematuria, incontinence, genital sores, muscle weakness, suspicious skin lesions, transient blindness, difficulty walking, depression, unusual weight change, abnormal bleeding, enlarged lymph nodes, angioedema, breast masses, and testicular masses.    Physical Exam  General:  weak but walks with a cane, alert Neck:  No deformities, masses, or  tenderness noted. Lungs:  Normal respiratory effort, chest expands symmetrically. Lungs are clear to auscultation, no crackles or wheezes. Heart:  Normal rate and regular rhythm. S1 and S2 normal without gallop, murmur, click, rub or other extra sounds. Neurologic:  alert & oriented X3, cranial nerves II-XII intact, and strength normal in all extremities.     Impression & Recommendations:  Problem # 1:  CARDIOMYOPATHY (ICD-425.4)  Problem # 2:  PULMONARY EMBOLISM (ICD-415.19)  His updated medication list for this problem includes:    Coumadin 5 Mg Tabs (Warfarin sodium) .Marland Kitchen... As directed  Problem # 3:  ATRIAL FIBRILLATION, PAROXYSMAL (ICD-427.31)  His updated medication list for this problem includes:    Coumadin 5 Mg Tabs (Warfarin sodium) .Marland Kitchen... As directed    Bisoprolol Fumarate 5 Mg Tabs (Bisoprolol fumarate) ..... One tablet daily  Problem # 4:  CHF (ICD-428.0)  His updated medication list for this problem includes:    Coumadin 5 Mg Tabs (Warfarin sodium) .Marland Kitchen... As directed    Ramipril 10 Mg Caps (Ramipril) .Marland Kitchen..Marland Kitchen Two tablets daily    Bisoprolol Fumarate 5 Mg Tabs (Bisoprolol fumarate) ..... One tablet daily  Problem # 5:  HYPOTHYROIDISM (ICD-244.9)  His updated medication list for this problem includes:    Levothroid 125 Mcg Tabs (Levothyroxine sodium) .Marland Kitchen... Take one tablet once daily  Problem # 6:  DIABETES MELLITUS, TYPE II (ICD-250.00)  His updated medication list for this problem includes:    Ramipril 10 Mg Caps (Ramipril) .Marland Kitchen..Marland Kitchen Two tablets daily    Metformin Hcl 500 Mg Tabs (Metformin  hcl) ..... 1 once daily  Complete Medication List: 1)  Multivitamins Tabs (Multiple vitamin) .Marland Kitchen.. 1 by mouth once daily 2)  Klor-con M20 20 Meq Tbcr (Potassium chloride crys cr) .... Take one tablet once daily 3)  Coumadin 5 Mg Tabs (Warfarin sodium) .... As directed 4)  Citrucel Powd (Methylcellulose (laxative)) .... Once daily 5)  Zoloft 25 Mg Tabs (Sertraline hcl) .... 3 once  daily 6)  Ramipril 10 Mg Caps (Ramipril) .... Two tablets daily 7)  Metformin Hcl 500 Mg Tabs (Metformin hcl) .Marland Kitchen.. 1 once daily 8)  Omeprazole 20 Mg Cpdr (Omeprazole) .... One by mouth daily 9)  Simvastatin 40 Mg Tabs (Simvastatin) .... One tablet daily 10)  Bisoprolol Fumarate 5 Mg Tabs (Bisoprolol fumarate) .... One tablet daily 11)  Catapres 0.2 Mg Tabs (Clonidine hcl) .Marland Kitchen.. 1 once daily 12)  Levothroid 125 Mcg Tabs (Levothyroxine sodium) .... Take one tablet once daily  Patient Instructions: 1)  He seems to be stable for now. Continue current measures. Continue with PT .  Prescriptions: CATAPRES 0.2 MG TABS (CLONIDINE HCL) 1 once daily  #30 x 11   Entered and Authorized by:   Nelwyn Salisbury MD   Signed by:   Nelwyn Salisbury MD on 02/08/2010   Method used:   Electronically to        [facility]  [phone]* (retail)       [address]       [place], [place]  [address]       Ph: [phone] or [phone]       Fax: [phone]   RxID:   [id number] COUMADIN 5 MG TABS (WARFARIN SODIUM) as directed  #60 x 11   Entered and Authorized by:   Nelwyn Salisbury MD   Signed by:   Nelwyn Salisbury MD on 02/08/2010   Method used:   Electronically to        [facility]  [phone]* (retail)       [address]       [place], [place]  [address]       Ph: [phone] or [phone]       Fax: [phone]   RxID:   [id number] METFORMIN HCL 500 MG TABS (METFORMIN HCL) 1 once daily  #30 x 11   Entered and Authorized by:   Nelwyn Salisbury MD   Signed by:   Nelwyn Salisbury MD on 02/08/2010   Method used:   Electronically to        [facility]  [phone]* (retail)       [address]       [place], [place]  [address]       Ph: [phone] or [phone]       Fax: [phone]   RxID:   [id number] CATAPRES 0.2 MG TABS (CLONIDINE HCL) 1 once daily  #30 x 11   Entered and Authorized by:    Nelwyn Salisbury MD   Signed by:   Nelwyn Salisbury MD on 02/08/2010   Method used:   Print then Give to Patient   RxID:   [id number] LEVOTHROID 125 MCG TABS (LEVOTHYROXINE SODIUM) take one tablet once daily  #90 x 3   Entered and Authorized by:   Nelwyn Salisbury MD   Signed by:   Nelwyn Salisbury MD  on 02/08/2010   Method used:   Print then Give to Patient   RxID:   [id number] ZOLOFT 25 MG  TABS (SERTRALINE HCL) 3 once daily  #270 x 3   Entered and Authorized by:   Nelwyn Salisbury MD   Signed by:   Nelwyn Salisbury MD on 02/08/2010   Method used:   Print then Give to Patient   RxID:   [id number] BISOPROLOL FUMARATE 5 MG TABS (BISOPROLOL FUMARATE) one tablet daily  #30 x 11   Entered and Authorized by:   Nelwyn Salisbury MD   Signed by:   Nelwyn Salisbury MD on 02/08/2010   Method used:   Electronically to        [facility]  [phone]* (retail)       [address]       [place], [place]  [address]       Ph: [phone] or [phone]       Fax: [phone]   RxID:   [id number] SIMVASTATIN 40 MG TABS (SIMVASTATIN) one tablet daily  #30 x 11   Entered and Authorized by:   Nelwyn Salisbury MD   Signed by:   Nelwyn Salisbury MD on 02/08/2010   Method used:   Electronically to        [facility]  [phone]* (retail)       [address]       [place], [place]  [address]       Ph: [phone] or [phone]       Fax: [phone]   RxID:   [id number]   Appended Document: fu on med/protime/njr   ANTICOAGULATION RECORD PREVIOUS REGIMEN & LAB RESULTS Anticoagulation Diagnosis:  V58.83,V58.61,415.19 on  02/16/2008 Previous INR Goal Range:  2.5-3.5 on  01/11/2010 Previous INR:  3.6 on  01/11/2010 Previous Coumadin Dose(mg):  10mg  on tue,thu 7.5mg  on other days on  11/15/2009 Previous Regimen:  same on  10/04/2009 Previous Coagulation Comments:  Missed 1 day on  04/16/2008  NEW REGIMEN & LAB RESULTS Current  INR: 3.2 Regimen: same  Repeat testing in: 4 weeks  Anticoagulation Visit Questionnaire Coumadin dose missed/changed:  No Abnormal Bleeding Symptoms:  No  Any diet changes including alcohol intake, vegetables or greens since the last visit:  No Any illnesses or hospitalizations since the last visit:  No Any signs of clotting since the last visit (including chest discomfort, dizziness, shortness of breath, arm tingling, slurred speech, swelling or redness in leg):  No  MEDICATIONS MULTIVITAMINS   TABS (MULTIPLE VITAMIN) 1 by mouth once daily KLOR-CON M20 20 MEQ TBCR (POTASSIUM CHLORIDE CRYS CR) take one tablet once daily COUMADIN 5 MG TABS (WARFARIN SODIUM) as directed CITRUCEL   POWD (METHYLCELLULOSE (LAXATIVE)) once daily ZOLOFT 25 MG  TABS (SERTRALINE HCL) 3 once daily RAMIPRIL 10 MG CAPS (RAMIPRIL) two tablets daily METFORMIN HCL 500 MG TABS (METFORMIN HCL) 1 once daily OMEPRAZOLE 20 MG CPDR (OMEPRAZOLE) one by mouth daily SIMVASTATIN 40 MG TABS (SIMVASTATIN) one tablet daily BISOPROLOL FUMARATE 5 MG TABS (BISOPROLOL FUMARATE) one tablet daily CATAPRES 0.2 MG TABS (CLONIDINE HCL) 1 once daily LEVOTHROID 125 MCG TABS (LEVOTHYROXINE SODIUM) take one tablet once daily    Laboratory Results   Blood Tests      INR: 3.2   (Normal Range: 0.88-1.12   Therap INR: 2.0-3.5) Comments: Rita Ohara  February 08, 2010 1:51  PM

## 2010-07-18 NOTE — Progress Notes (Signed)
Summary: FYI  Phone Note From Other Clinic Call back at [phone]   Caller: Waynetta Sandy, phys therapist Summary of Call: calling to inform us of his condition- wouldn't leave msg Initial call taken by: VM  Follow-up for Phone Call        [facility] Follow-up by: Raechel Ache, RN,  February 06, 2010 1:59 PM  Additional Follow-up for Phone Call Additional follow up Details #1::        therapist called back- wanted to make [place] aware she's seen him twice and he had soiled himself; she called son to inform him.   FYI only Additional Follow-up by: VM    Additional Follow-up for Phone Call Additional follow up Details #2::    noted Follow-up by: Nelwyn Salisbury MD,  February 06, 2010 3:29 PM

## 2010-07-19 DEATH — deceased

## 2010-07-20 NOTE — Letter (Signed)
Summary: Death Certificate/[facility] [employer] [facility]  Death Certificate/[facility] [employer] [facility]   Imported By: Maryln Gottron 07/04/2010 13:22:43  _____________________________________________________________________  External Attachment:    Type:   Image     Comment:   External Document

## 2010-07-20 NOTE — Miscellaneous (Signed)
Summary: Certification and Plan of Care/[facility]  Certification and Plan of Care/[facility]   Imported By: Maryln Gottron 07/03/2010 12:27:17  _____________________________________________________________________  External Attachment:    Type:   Image     Comment:   External Document

## 2010-07-20 NOTE — Miscellaneous (Signed)
Summary: Certification and Plan of Care/[facility]  Certification and Plan of Care/[facility]   Imported By: Maryln Gottron 06/01/2010 13:48:25  _____________________________________________________________________  External Attachment:    Type:   Image     Comment:   External Document

## 2010-07-31 NOTE — Discharge Summary (Signed)
[name], [name] NO.:  [id number]  MEDICAL RECORD NO.:  [id number]          PATIENT TYPE:  INP  LOCATION:  4528                         FACILITY:  [facility]  PHYSICIAN:  Nelda Bucks, MD DATE OF BIRTH:  06-Sep-1934  DATE OF ADMISSION:  07/11/2010 DATE OF DISCHARGE:  2010/06/30                              DISCHARGE SUMMARY   DEATH SUMMARY.  Admitted by Dr. Coletta Memos initially for brainstem hemorrhage.  This is a 76 year old gentleman who was recently seen in the emergency room in December after sustaining a pulmonary embolism, placed on Coumadin at that time, last INR was 1.9 pounds, found on the day of admission on June 23, 2010, by his daughter with altered mental status.  He was brought from the nursing home to the emergency room and was thought to have had an urinary tract infection.  Head CT was ordered and revealed subarachnoid blood in the prepontine region and hemorrhage into the mid brain.  He had dilated ventricles and very small amount of blood in the posterior horns of the occipital horn and lateral ventricles.  He was evaluated of course by Dr. Franky Macho, the neurosurgeon, and admitted to 3100, required intubation, airway protection for respiratory failure. Hospital course was significant for lack of progression from his neurologic standpoint, poor neurologic examination secondary to brainstem location.  The patient of course was intubated on June 23, 2010, as noted and had a left IJ placed on June 26, 2010, had a hypertensive crisis initially on admission treated with nicardipine successfully.  He was transitioned over to Cardizem for treatment of atrial fibrillation and rapid ventricle response for heart rate control which nicardipine does not give.  CT scan repeats were considered.  The etiology was not clear, hypertension versus underlying aneurysm, although classification was very poor regardless if there was an aneurysm  present.  The patient's mental status was very, very poor.  DNR discussions were held with his children, very appropriate and aware of his poor prognosis and the patient's prior wishes.  He was weaning on some CPAP and pressor support of course with inability to extubate secondary to poor neurologic status, had increasing fevers noted on June 26, 2010, and secretions, was treated with antibiotics, thought presumed to aspiration.  Cipro and Flagyl was added.  Blood pressure became better controlled.  We will try to avoid positive balance. Neurology was also consulted for this patient's care.  There was concerns of seizure activity.  The patient was treated with Dilantin therapy on June 26, 2010, and benzodiazepines.  EEG was ordered. Essentially became very clear this patient had increasing seizure activity and poor neurologic status, and family was clear that they did not want prolonged suffering on the ventilator machine and the patient was extubated for comfort care and expired.  FINAL DIAGNOSES OF DEATH: 1. Subarachnoid hemorrhage with brainstem hemorrhage to rule out     hypertensive related versus aneurysm. 2. Acute respiratory failure requiring intubation, type 4 respiratory     failure, inability to protect airway. 3. Hypertensive crisis. 4. Status post pulmonary embolism requiring Coumadin therapy.     Jacob Boom  Daneil Dan, MD     DJF/MEDQ  D:  07/17/2010  T:  07/18/2010  Job:  [id number]  Electronically Signed by Jacob Percy MD on 07/31/2010 09:27:41 PM

## 2010-08-28 LAB — CBC
HCT: 32.6 % — ABNORMAL LOW (ref 39.0–52.0)
HCT: 35.3 % — ABNORMAL LOW (ref 39.0–52.0)
Hemoglobin: 10.9 g/dL — ABNORMAL LOW (ref 13.0–17.0)
Hemoglobin: 10.9 g/dL — ABNORMAL LOW (ref 13.0–17.0)
Hemoglobin: 11.9 g/dL — ABNORMAL LOW (ref 13.0–17.0)
Hemoglobin: 12.8 g/dL — ABNORMAL LOW (ref 13.0–17.0)
MCH: 31.1 pg (ref 26.0–34.0)
MCHC: 33.4 g/dL (ref 30.0–36.0)
MCHC: 33.4 g/dL (ref 30.0–36.0)
MCHC: 33.7 g/dL (ref 30.0–36.0)
MCV: 93.1 fL (ref 78.0–100.0)
RBC: 4.19 MIL/uL — ABNORMAL LOW (ref 4.22–5.81)
WBC: 10.6 10*3/uL — ABNORMAL HIGH (ref 4.0–10.5)

## 2010-08-28 LAB — BASIC METABOLIC PANEL
BUN: 14 mg/dL (ref 6–23)
CO2: 27 mEq/L (ref 19–32)
Calcium: 7.7 mg/dL — ABNORMAL LOW (ref 8.4–10.5)
Creatinine, Ser: 1.44 mg/dL (ref 0.4–1.5)
GFR calc non Af Amer: 52 mL/min — ABNORMAL LOW (ref >60)
Glucose, Bld: 206 mg/dL — ABNORMAL HIGH (ref 70–99)
Glucose, Bld: 131 mg/dL — ABNORMAL HIGH (ref 70–99)
Potassium: 3.9 mEq/L (ref 3.5–5.1)

## 2010-08-28 LAB — DIFFERENTIAL
Lymphocytes Relative: 5 % — ABNORMAL LOW (ref 12–46)
Lymphs Abs: 0.6 10*3/uL — ABNORMAL LOW (ref 0.7–4.0)
Monocytes Relative: 5 % (ref 3–12)
Neutro Abs: 11.6 10*3/uL — ABNORMAL HIGH (ref 1.7–7.7)
Neutrophils Relative %: 90 % — ABNORMAL HIGH (ref 43–77)

## 2010-08-28 LAB — COMPREHENSIVE METABOLIC PANEL
ALT: 11 U/L (ref 0–53)
Albumin: 2.6 g/dL — ABNORMAL LOW (ref 3.5–5.2)
Alkaline Phosphatase: 53 U/L (ref 39–117)
BUN: 17 mg/dL (ref 6–23)
Chloride: 110 mEq/L (ref 96–112)
Potassium: 3.4 mEq/L — ABNORMAL LOW (ref 3.5–5.1)
Sodium: 145 mEq/L (ref 135–145)
Total Bilirubin: 1.4 mg/dL — ABNORMAL HIGH (ref 0.3–1.2)

## 2010-08-28 LAB — GLUCOSE, CAPILLARY
Glucose-Capillary: 109 mg/dL — ABNORMAL HIGH (ref 70–99)
Glucose-Capillary: 100 mg/dL — ABNORMAL HIGH (ref 70–99)
Glucose-Capillary: 116 mg/dL — ABNORMAL HIGH (ref 70–99)
Glucose-Capillary: 122 mg/dL — ABNORMAL HIGH (ref 70–99)
Glucose-Capillary: 147 mg/dL — ABNORMAL HIGH (ref 70–99)

## 2010-08-28 LAB — PROTIME-INR
INR: 1.96 — ABNORMAL HIGH (ref 0.00–1.49)
INR: 1.22 (ref 0.00–1.49)
Prothrombin Time: 15.6 seconds — ABNORMAL HIGH (ref 11.6–15.2)
Prothrombin Time: 21.4 seconds — ABNORMAL HIGH (ref 11.6–15.2)

## 2010-08-28 LAB — HEMOGLOBIN A1C
Hgb A1c MFr Bld: 5.6 % (ref <5.7)
Mean Plasma Glucose: 114 mg/dL (ref <117)

## 2010-08-28 LAB — POCT I-STAT, CHEM 8
Chloride: 106 mEq/L (ref 96–112)
Creatinine, Ser: 1.6 mg/dL — ABNORMAL HIGH (ref 0.4–1.5)
Glucose, Bld: 162 mg/dL — ABNORMAL HIGH (ref 70–99)
Potassium: 3.5 mEq/L (ref 3.5–5.1)

## 2010-08-28 LAB — LIPID PANEL
LDL Cholesterol: 93 mg/dL (ref 0–99)
Total CHOL/HDL Ratio: 4.3 RATIO
Triglycerides: 58 mg/dL (ref <150)
VLDL: 12 mg/dL (ref 0–40)

## 2010-08-28 LAB — POCT CARDIAC MARKERS
CKMB, poc: 2.1 ng/mL (ref 1.0–8.0)
CKMB, poc: 2.7 ng/mL (ref 1.0–8.0)
Myoglobin, poc: >500 ng/mL (ref 12–200)
Troponin i, poc: <0.05 ng/mL (ref 0.00–0.09)

## 2010-08-28 LAB — TROPONIN I: Troponin I: 0.03 ng/mL (ref 0.00–0.06)

## 2010-08-28 LAB — CARDIAC PANEL(CRET KIN+CKTOT+MB+TROPI)
CK, MB: 1.4 ng/mL (ref 0.3–4.0)
Relative Index: 1.7 (ref 0.0–2.5)
Relative Index: 2 (ref 0.0–2.5)
Total CK: 53 U/L (ref 7–232)
Troponin I: 0.02 ng/mL (ref 0.00–0.06)
Troponin I: 0.03 ng/mL (ref 0.00–0.06)

## 2010-08-28 LAB — URINALYSIS, ROUTINE W REFLEX MICROSCOPIC
Ketones, ur: 15 mg/dL — AB
Nitrite: POSITIVE — AB
Protein, ur: 100 mg/dL — AB
Urobilinogen, UA: 1 mg/dL (ref 0.0–1.0)

## 2010-08-28 LAB — URINE CULTURE
Colony Count: >=100000
Culture  Setup Time: 201112130143

## 2010-08-28 LAB — CK TOTAL AND CKMB (NOT AT ARMC): Relative Index: 2.6 — ABNORMAL HIGH (ref 0.0–2.5)

## 2010-08-29 LAB — URINE CULTURE
Colony Count: NO GROWTH
Culture  Setup Time: 201111220319
Culture: NO GROWTH

## 2010-08-29 LAB — CBC
HCT: 36.5 % — ABNORMAL LOW (ref 39.0–52.0)
HCT: 42.1 % (ref 39.0–52.0)
MCH: 32.2 pg (ref 26.0–34.0)
MCHC: 35.2 g/dL (ref 30.0–36.0)
MCV: 91.5 fL (ref 78.0–100.0)
MCV: 92.3 fL (ref 78.0–100.0)
Platelets: 207 10*3/uL (ref 150–400)
RBC: 4.57 MIL/uL (ref 4.22–5.81)
RDW: 13 % (ref 11.5–15.5)
RDW: 13 % (ref 11.5–15.5)
WBC: 10.8 10*3/uL — ABNORMAL HIGH (ref 4.0–10.5)

## 2010-08-29 LAB — BASIC METABOLIC PANEL
BUN: 29 mg/dL — ABNORMAL HIGH (ref 6–23)
BUN: 30 mg/dL — ABNORMAL HIGH (ref 6–23)
Calcium: 8.5 mg/dL (ref 8.4–10.5)
Chloride: 109 mEq/L (ref 96–112)
Chloride: 109 mEq/L (ref 96–112)
Creatinine, Ser: 1.71 mg/dL — ABNORMAL HIGH (ref 0.4–1.5)
Creatinine, Ser: 1.81 mg/dL — ABNORMAL HIGH (ref 0.4–1.5)
GFR calc Af Amer: 48 mL/min — ABNORMAL LOW (ref >60)
GFR calc non Af Amer: 39 mL/min — ABNORMAL LOW (ref >60)
Glucose, Bld: 127 mg/dL — ABNORMAL HIGH (ref 70–99)
Potassium: 3.4 mEq/L — ABNORMAL LOW (ref 3.5–5.1)

## 2010-08-29 LAB — POCT CARDIAC MARKERS
CKMB, poc: <1 ng/mL — ABNORMAL LOW (ref 1.0–8.0)
Troponin i, poc: <0.05 ng/mL (ref 0.00–0.09)
Troponin i, poc: <0.05 ng/mL (ref 0.00–0.09)

## 2010-08-29 LAB — GLUCOSE, CAPILLARY
Glucose-Capillary: 110 mg/dL — ABNORMAL HIGH (ref 70–99)
Glucose-Capillary: 117 mg/dL — ABNORMAL HIGH (ref 70–99)
Glucose-Capillary: 149 mg/dL — ABNORMAL HIGH (ref 70–99)
Glucose-Capillary: 177 mg/dL — ABNORMAL HIGH (ref 70–99)
Glucose-Capillary: 80 mg/dL (ref 70–99)
Glucose-Capillary: 82 mg/dL (ref 70–99)

## 2010-08-29 LAB — POCT I-STAT, CHEM 8
BUN: 19 mg/dL (ref 6–23)
Calcium, Ion: 1.03 mmol/L — ABNORMAL LOW (ref 1.12–1.32)
Chloride: 107 mEq/L (ref 96–112)
Creatinine, Ser: 1.7 mg/dL — ABNORMAL HIGH (ref 0.4–1.5)
Glucose, Bld: 162 mg/dL — ABNORMAL HIGH (ref 70–99)
TCO2: 23 mmol/L (ref 0–100)

## 2010-08-29 LAB — URINE MICROSCOPIC-ADD ON

## 2010-08-29 LAB — DIFFERENTIAL
Basophils Absolute: 0 10*3/uL (ref 0.0–0.1)
Eosinophils Relative: 0 % (ref 0–5)
Lymphocytes Relative: 16 % (ref 12–46)
Lymphs Abs: 1.7 10*3/uL (ref 0.7–4.0)
Monocytes Absolute: 0.5 10*3/uL (ref 0.1–1.0)
Neutro Abs: 8.5 10*3/uL — ABNORMAL HIGH (ref 1.7–7.7)

## 2010-08-29 LAB — URINALYSIS, ROUTINE W REFLEX MICROSCOPIC
Hgb urine dipstick: NEGATIVE
Protein, ur: 100 mg/dL — AB
Urobilinogen, UA: 1 mg/dL (ref 0.0–1.0)

## 2010-09-01 LAB — CBC
HCT: 40.2 % (ref 39.0–52.0)
HCT: 46.9 % (ref 39.0–52.0)
Hemoglobin: 16.4 g/dL (ref 13.0–17.0)
MCHC: 35 g/dL (ref 30.0–36.0)
Platelets: 222 10*3/uL (ref 150–400)
RBC: 4.44 MIL/uL (ref 4.22–5.81)
RDW: 12.8 % (ref 11.5–15.5)
RDW: 13.2 % (ref 11.5–15.5)
WBC: 9.3 10*3/uL (ref 4.0–10.5)
WBC: 9.4 10*3/uL (ref 4.0–10.5)

## 2010-09-01 LAB — BASIC METABOLIC PANEL
BUN: 17 mg/dL (ref 6–23)
BUN: 20 mg/dL (ref 6–23)
Calcium: 8.9 mg/dL (ref 8.4–10.5)
Creatinine, Ser: 1.59 mg/dL — ABNORMAL HIGH (ref 0.4–1.5)
GFR calc Af Amer: 52 mL/min — ABNORMAL LOW (ref >60)
GFR calc non Af Amer: 43 mL/min — ABNORMAL LOW (ref >60)
GFR calc non Af Amer: 54 mL/min — ABNORMAL LOW (ref >60)
Glucose, Bld: 174 mg/dL — ABNORMAL HIGH (ref 70–99)
Potassium: 3.2 mEq/L — ABNORMAL LOW (ref 3.5–5.1)
Potassium: 3.4 mEq/L — ABNORMAL LOW (ref 3.5–5.1)
Sodium: 142 mEq/L (ref 135–145)

## 2010-09-01 LAB — GLUCOSE, CAPILLARY
Glucose-Capillary: 136 mg/dL — ABNORMAL HIGH (ref 70–99)
Glucose-Capillary: 169 mg/dL — ABNORMAL HIGH (ref 70–99)
Glucose-Capillary: 95 mg/dL (ref 70–99)

## 2010-09-01 LAB — HEMOGLOBIN A1C: Hgb A1c MFr Bld: 5.8 % — ABNORMAL HIGH (ref <5.7)

## 2010-09-01 LAB — URINALYSIS, ROUTINE W REFLEX MICROSCOPIC
Bilirubin Urine: NEGATIVE
Glucose, UA: NEGATIVE mg/dL
Specific Gravity, Urine: 1.014 (ref 1.005–1.030)
pH: 6 (ref 5.0–8.0)

## 2010-09-01 LAB — TSH: TSH: 5.68 u[IU]/mL — ABNORMAL HIGH (ref 0.350–4.500)

## 2010-09-01 LAB — URINE MICROSCOPIC-ADD ON

## 2010-09-01 LAB — CK TOTAL AND CKMB (NOT AT ARMC)
CK, MB: 0.9 ng/mL (ref 0.3–4.0)
CK, MB: 1.2 ng/mL (ref 0.3–4.0)
CK, MB: 1.5 ng/mL (ref 0.3–4.0)
Relative Index: INVALID (ref 0.0–2.5)
Total CK: 52 U/L (ref 7–232)
Total CK: 59 U/L (ref 7–232)

## 2010-09-01 LAB — PROTIME-INR
INR: 1.65 — ABNORMAL HIGH (ref 0.00–1.49)
INR: 1.72 — ABNORMAL HIGH (ref 0.00–1.49)
Prothrombin Time: 20.3 seconds — ABNORMAL HIGH (ref 11.6–15.2)

## 2010-09-01 LAB — URINE CULTURE: Culture  Setup Time: 201108112241

## 2010-09-01 LAB — DIFFERENTIAL
Basophils Absolute: 0 10*3/uL (ref 0.0–0.1)
Basophils Relative: 0 % (ref 0–1)
Lymphocytes Relative: 14 % (ref 12–46)
Monocytes Absolute: 0.4 10*3/uL (ref 0.1–1.0)
Neutro Abs: 7.6 10*3/uL (ref 1.7–7.7)
Neutrophils Relative %: 82 % — ABNORMAL HIGH (ref 43–77)

## 2010-09-01 LAB — TROPONIN I: Troponin I: 0.02 ng/mL (ref 0.00–0.06)

## 2010-09-01 LAB — APTT: aPTT: 24 seconds (ref 24–37)

## 2010-09-01 LAB — LACTIC ACID, PLASMA: Lactic Acid, Venous: 3 mmol/L — ABNORMAL HIGH (ref 0.5–2.2)

## 2010-09-03 LAB — CARDIAC PANEL(CRET KIN+CKTOT+MB+TROPI)
Relative Index: 0.8 (ref 0.0–2.5)
Relative Index: 1.1 (ref 0.0–2.5)
Total CK: 217 U/L (ref 7–232)
Total CK: 271 U/L — ABNORMAL HIGH (ref 7–232)
Troponin I: 0.02 ng/mL (ref 0.00–0.06)
Troponin I: 0.03 ng/mL (ref 0.00–0.06)

## 2010-09-03 LAB — COMPREHENSIVE METABOLIC PANEL
ALT: 18 U/L (ref 0–53)
Albumin: 2.8 g/dL — ABNORMAL LOW (ref 3.5–5.2)
Alkaline Phosphatase: 48 U/L (ref 39–117)
BUN: 21 mg/dL (ref 6–23)
Chloride: 108 mEq/L (ref 96–112)
Glucose, Bld: 153 mg/dL — ABNORMAL HIGH (ref 70–99)
Potassium: 3.3 mEq/L — ABNORMAL LOW (ref 3.5–5.1)
Sodium: 143 mEq/L (ref 135–145)
Total Bilirubin: 0.8 mg/dL (ref 0.3–1.2)

## 2010-09-03 LAB — CBC
HCT: 37.7 % — ABNORMAL LOW (ref 39.0–52.0)
HCT: 40.2 % (ref 39.0–52.0)
Hemoglobin: 13.3 g/dL (ref 13.0–17.0)
Hemoglobin: 13.8 g/dL (ref 13.0–17.0)
MCHC: 34.4 g/dL (ref 30.0–36.0)
MCHC: 35.3 g/dL (ref 30.0–36.0)
MCV: 89.9 fL (ref 78.0–100.0)
MCV: 91.3 fL (ref 78.0–100.0)
Platelets: 234 10*3/uL (ref 150–400)
Platelets: 239 10*3/uL (ref 150–400)
RBC: 4.19 MIL/uL — ABNORMAL LOW (ref 4.22–5.81)
RBC: 4.4 MIL/uL (ref 4.22–5.81)
RDW: 13 % (ref 11.5–15.5)
RDW: 13.1 % (ref 11.5–15.5)
WBC: 6.8 10*3/uL (ref 4.0–10.5)
WBC: 8 10*3/uL (ref 4.0–10.5)

## 2010-09-03 LAB — BASIC METABOLIC PANEL
BUN: 18 mg/dL (ref 6–23)
Chloride: 108 mEq/L (ref 96–112)
Creatinine, Ser: 1.34 mg/dL (ref 0.4–1.5)
GFR calc Af Amer: >60 mL/min (ref >60)
GFR calc non Af Amer: 52 mL/min — ABNORMAL LOW (ref >60)
Potassium: 3.3 mEq/L — ABNORMAL LOW (ref 3.5–5.1)

## 2010-09-03 LAB — URINALYSIS, ROUTINE W REFLEX MICROSCOPIC
Glucose, UA: NEGATIVE mg/dL
Hgb urine dipstick: NEGATIVE
Ketones, ur: NEGATIVE mg/dL
Nitrite: NEGATIVE
Protein, ur: NEGATIVE mg/dL
Specific Gravity, Urine: 1.02 (ref 1.005–1.030)
Urobilinogen, UA: 1 mg/dL (ref 0.0–1.0)
pH: 5.5 (ref 5.0–8.0)

## 2010-09-03 LAB — LIPID PANEL
Cholesterol: 246 mg/dL — ABNORMAL HIGH (ref 0–200)
HDL: 36 mg/dL — ABNORMAL LOW (ref >39)
LDL Cholesterol: 189 mg/dL — ABNORMAL HIGH (ref 0–99)
Total CHOL/HDL Ratio: 6.8 RATIO
Triglycerides: 104 mg/dL (ref <150)
VLDL: 21 mg/dL (ref 0–40)

## 2010-09-03 LAB — TSH: TSH: 7.217 u[IU]/mL — ABNORMAL HIGH (ref 0.350–4.500)

## 2010-09-03 LAB — TROPONIN I: Troponin I: 0.06 ng/mL (ref 0.00–0.06)

## 2010-09-03 LAB — GLUCOSE, CAPILLARY
Glucose-Capillary: 100 mg/dL — ABNORMAL HIGH (ref 70–99)
Glucose-Capillary: 103 mg/dL — ABNORMAL HIGH (ref 70–99)
Glucose-Capillary: 124 mg/dL — ABNORMAL HIGH (ref 70–99)
Glucose-Capillary: 149 mg/dL — ABNORMAL HIGH (ref 70–99)
Glucose-Capillary: 156 mg/dL — ABNORMAL HIGH (ref 70–99)
Glucose-Capillary: 165 mg/dL — ABNORMAL HIGH (ref 70–99)

## 2010-09-03 LAB — DIFFERENTIAL
Basophils Absolute: 0 10*3/uL (ref 0.0–0.1)
Basophils Relative: 0 % (ref 0–1)
Eosinophils Absolute: 0.1 10*3/uL (ref 0.0–0.7)
Monocytes Absolute: 0.5 10*3/uL (ref 0.1–1.0)
Neutro Abs: 6.3 10*3/uL (ref 1.7–7.7)

## 2010-09-03 LAB — POCT CARDIAC MARKERS
CKMB, poc: 1.4 ng/mL (ref 1.0–8.0)
CKMB, poc: <1 ng/mL — ABNORMAL LOW (ref 1.0–8.0)
Myoglobin, poc: 106 ng/mL (ref 12–200)
Myoglobin, poc: 142 ng/mL (ref 12–200)
Troponin i, poc: <0.05 ng/mL (ref 0.00–0.09)
Troponin i, poc: <0.05 ng/mL (ref 0.00–0.09)

## 2010-09-03 LAB — PROTIME-INR
INR: 1.72 — ABNORMAL HIGH (ref 0.00–1.49)
INR: 1.8 — ABNORMAL HIGH (ref 0.00–1.49)
INR: 2.01 — ABNORMAL HIGH (ref 0.00–1.49)
Prothrombin Time: 20 seconds — ABNORMAL HIGH (ref 11.6–15.2)
Prothrombin Time: 20.7 seconds — ABNORMAL HIGH (ref 11.6–15.2)
Prothrombin Time: 22.6 seconds — ABNORMAL HIGH (ref 11.6–15.2)

## 2010-09-03 LAB — HEMOGLOBIN A1C
Hgb A1c MFr Bld: 6.1 % (ref 4.6–6.1)
Mean Plasma Glucose: 128 mg/dL

## 2010-09-03 LAB — APTT: aPTT: 27 seconds (ref 24–37)

## 2010-09-03 LAB — MAGNESIUM: Magnesium: 2 mg/dL (ref 1.5–2.5)

## 2010-10-02 LAB — PROTIME-INR
INR: 3.5 — ABNORMAL HIGH (ref 0.00–1.49)
Prothrombin Time: 37.7 seconds — ABNORMAL HIGH (ref 11.6–15.2)

## 2010-10-02 LAB — POCT I-STAT, CHEM 8
Chloride: 107 mEq/L (ref 96–112)
Creatinine, Ser: 1.4 mg/dL (ref 0.4–1.5)
HCT: 44 % (ref 39.0–52.0)
Hemoglobin: 15 g/dL (ref 13.0–17.0)
Potassium: 2.9 mEq/L — ABNORMAL LOW (ref 3.5–5.1)
Sodium: 142 mEq/L (ref 135–145)

## 2010-10-02 LAB — CBC
MCV: 91.4 fL (ref 78.0–100.0)
Platelets: 289 10*3/uL (ref 150–400)
RBC: 4.9 MIL/uL (ref 4.22–5.81)
WBC: 9.9 10*3/uL (ref 4.0–10.5)

## 2010-10-02 LAB — DIFFERENTIAL
Eosinophils Absolute: 0 10*3/uL (ref 0.0–0.7)
Lymphs Abs: 2.2 10*3/uL (ref 0.7–4.0)
Neutro Abs: 7.1 10*3/uL (ref 1.7–7.7)
Neutrophils Relative %: 72 % (ref 43–77)

## 2010-10-02 LAB — URINALYSIS, ROUTINE W REFLEX MICROSCOPIC
Hgb urine dipstick: NEGATIVE
Nitrite: NEGATIVE
Specific Gravity, Urine: 1.018 (ref 1.005–1.030)
Urobilinogen, UA: 1 mg/dL (ref 0.0–1.0)
pH: 7.5 (ref 5.0–8.0)

## 2010-11-03 NOTE — Discharge Summary (Signed)
Jacob Navarro, ARGANBRIGHT NO.:  [id number]   MEDICAL RECORD NO.:  [id number]          PATIENT TYPE:  INP   LOCATION:  4733                         FACILITY:  [facility]   PHYSICIAN:  Thomos Lemons, D.O. [facility]   DATE OF BIRTH:  1935/03/12   DATE OF ADMISSION:  11/15/2005  DATE OF DISCHARGE:  11/18/2005                                 DISCHARGE SUMMARY   DISCHARGE DIAGNOSES:  1.  Status post fall with elevated CPK.  2.  Acute renal insufficiency secondary to dehydration.  3.  Hypokalemia.  4.  History of recurrent falls, unclear etiology.  5.  History of cerebrovascular accident both cortical and subcortical.  6.  History of paroxysmal atrial fibrillation on Coumadin.  7.  Hypercholesterolemia.  8.  History of urinary tract infection.  9.  History of peripheral vascular disease.  10. Hypothyroidism.  11. Gastroesophageal reflux disease.  12. Hypertension.   DISCHARGE MEDICATIONS:  1.  Coumadin 10 mg once a day x3 days.  2.  Synthroid 100 mcg once daily.  3.  Diovan 160 mg once a day.  4.  Norvasc 10 mg once a day.  5.  Lopressor 100 mg twice daily.  6.  Zoloft 20 mg once a day.  7.  Omeprazole 20 mg before a.m. meal.  8.  Clonidine 0.2 mg twice daily.  9.  Hydralazine 10 mg four times daily.  10. Patient was instructed to discontinue Zocor.   FOLLOW UP INSTRUCTIONS:  He is to call for an appointment with Dr. Clent Ridges in  one week after discharge.  In addition, patient was told to have his INR  checked in two days with the results forwarded to Dr. Clent Ridges for further  instructions.   HOSPITAL COURSE:  Patient is a 76 year old white male who was admitted with  weakness and fall x3 within two days of admission.  Patient is noted to have  a history of recurrent falls.  He was brought to the ER by EMS with some  soreness of the rib cage and abdominal area.  Patient also had an abrasion  on his left elbow.   Upon admission, patient was noted to have elevated CPK of 2057.  This  had  trended lower and on the day of discharge his CPK was 484.   RADIOLOGIC DATA:  Portable chest on November 16, 2005, cardiomegaly with mild  basilar atelectasis/scarring.  Patient had an MRI of the brain without  contrast.  Impression:  1.  Motion degraded examination without evidence of acute infarct.  2.  Atrophy, significant small vessel disease, several old infarcts.  3.  Punctate areas of altered signal intensity consistent with hemorrhagic      breakdown.  This may be related to prior episodes of ischemia.  Patient      also noted to have some maxillary sinus with opacification, right      greater than left.   PROBLEM #1 -  STATUS POST FALL:  Patient has a known history of  noncompliance and patient, although not obviously ataxic, has a gait  abnormality and recommended physical  therapy follow up at home in an effort  to try to prevent falls in the future.   PROBLEM #2 -  ACUTE RENAL INSUFFICIENCY:  Patient had creatinine of 1.3 on  admission which returned to normal with IV hydration at 1.1.  Consideration  of rhabdomyolysis possibly exacerbating acute renal failure was considered.   PROBLEM #3 -  ELEVATED CPK/RHABDOMYOLYSIS:  May be secondary to fall,  however, patient is also on Statin medication, Zocor.  Zocor was  discontinued during his hospitalization.  Consideration to restart Statin  type medication should be reviewed as an outpatient with Dr. Clent Ridges.   PROBLEM #4 -  HYPOTHYROIDISM:  Patient's TSH was elevated on hospital  admission.  Patient is known to be noncompliant.  His TSH in hospital is  7.924.  This will need to be rechecked in approximately one month's time  after patient has taken his Synthroid on a regular basis.   PROBLEM #5 -  GASTROESOPHAGEAL REFLUX DISEASE:  He is to continue  omeprazole.  This was asymptomatic.   PROBLEM #6 -  PAROXYSMAL ATRIAL FIBRILLATION ON COUMADIN:  At this time,  patient was restarted on Coumadin, however, due to his history of  recurrent  falls, if patient cannot be stabilized, we should seriously consider  discontinuing Coumadin due to higher risk of having bleeding complication.  This can be further discussed with Dr. Clent Ridges as an outpatient.   PROBLEM #7 -  HYPERTENSION:  Patient with a history of CVAs, had very  difficult to control blood pressure despite multiple medications.  Unclear  at this time whether a work-up for secondary hypertension was performed as  an outpatient.  We did add hydralazine 10 mg four times a day but seemed to  improve his blood pressure.  Clonidine was also increased to 0.2 mg b.i.d.  Patient will need follow-up of his blood pressure as an outpatient.   PROBLEM #8 -  HISTORY OF DEPRESSION:  Daughter notes that he has been  forgetting to take his medications and although he is on Zoloft 25 mg, may  need an upward titration of his SSRI versus adding additional agents.   DISCHARGE LABORATORY DATA:  PT was 19.4, INR 1.6.  Sodium 141, potassium  4.4, chloride 109, CO2 25, BUN 15, creatinine 1.1 and a blood sugar of 126.  CPK was 484.  TSH was 7.924.  Patient had negative cardiac enzymes x3.   DISPOSITION:  Social work recommended possible short term rehab, however,  patient adamantly did not want placement and wanted to return home at  discharge.  At time of this dictation, we are working on arranging home  health aid and also home PT.  His discharge will be pending finalization of  these arrangements.      Thomos Lemons, D.O. [facility]  Electronically Signed     RY/MEDQ  D:  11/18/2005  T:  11/19/2005  Job:  [id number]   cc:   [name], M.D. [facility]  [address] [place]  [place] [address]

## 2010-11-03 NOTE — Consult Note (Signed)
NAMEHAYATO, Navarro NO.:  [id number]   MEDICAL RECORD NO.:  [id number]          PATIENT TYPE:  EMS   LOCATION:  MAJO                         FACILITY:  [facility]   PHYSICIAN:  Georga Hacking, M.D.DATE OF BIRTH:  1935/05/30   DATE OF CONSULTATION:  02/07/2006  DATE OF DISCHARGE:                                   CONSULTATION   I was asked to see this 76 year old male for evaluation of elevated cardiac  troponin.  The patient is a very poor historian.  Review of the old records  reveals that he has a history of previous stroke and has had recurrent falls  that have been unexplained.  His last admission to the hospital was in June  of this year when he was admitted with renal insufficiency, elevated CPK,  and fall with unresponsiveness.  He has a history of non-compliance and has  run out of his medicines on several occasions.  He evidently was taking  laundry down the stairs today and fell down several stairs.  He may have  waited about an hour before calling EMS and was brought here and was found  to be tachycardic.  He was complaining of some left sided chest pain which  hurt to take a deep breath, but no anterior chest pressure.  He was somewhat  tachycardic on EKG and had some minor changes in the lateral leads.  He had  left axis deviation.  He was uncertain whether he took his medicines this  morning or not.  The emergency room physician asked me to see him because of  the elevated troponin of 0.11.  His myoglobin was also elevated.  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 coli UTIs, and  history of recurrent falls with rhabdomyolysis, peripheral vascular disease,  carotid artery disease, hypothyroidism, reflux, and noncompliance.   His medications were somewhat difficult.  He did  not have any of the current  ones with him.  The daughter seemed to think he was on Synthroid, Diovan,  Metoprolol, Norvasc, Clonidine and Zoloft, that she could remember, but  could not remember the other ones.   SOCIAL HISTORY:  He evidently lives alone.  He is retired from [employer].  He does not smoke or abuse alcohol.  His daughter tries to check on him  daily.   FAMILY HISTORY:  Really not obtainable.   REVIEW OF SYMPTOMS:  Really not obtainable.   PHYSICAL EXAMINATION:  GENERAL:  He is an elderly male who has a somewhat flat affect.  VITAL SIGNS:  Pulse 110, blood pressure 150/80.  SKIN:  Warm and dry.  HEENT:  EOMI, PERRLA, CNS clear, pharynx negative.  NECK:  Supple without masses.  No JVD or carotid bruits.  LUNGS:  Clear.  CARDIOVASCULAR:  Normal S1 and S2, rapid rhythm.  ABDOMEN:  Soft, nontender, no edema noted.  EXTREMITIES:  Pulses 2+.   LABORATORY DATA:  Hemoglobin 16.3,  hematocrit 48.  D-dimer greater than 20.  Sodium 137, potassium 3.6, BUN 26, creatinine 2.  CPKs MB 3.3, troponin  0.11, myoglobin greater than 500.  EKG showed sinus tachycardia, nonspecific  changes lateral, left axis deviation.   IMPRESSION:  1. Atypical chest pain, possibly due to fall.  2. Recurrent falls.  3. History of subcortical CVA.  4. History of paroxysmal atrial fibrillation.  5. Elevated troponin and myoglobin of uncertain causes, could be      rhabdomyolysis.  6. Hypertension.  7. History of noncompliance.  8. Hypothyroidism.   RECOMMENDATIONS:  Very difficult historian.  The pain does not really sound  cardiac and the EKG is not acute.  He is being seen by the primary care  doctors.  He is tachycardia but this may be due to withdrawal from beta  blockers.  I would give him IV beta blockers and start him on Metoprolol  b.i.d.  I would check serial enzymes and EKGs on him.  Further workup  depending on results of the above.      Georga Hacking, M.D.  Electronically  Signed     WST/MEDQ  D:  02/07/2006  T:  02/07/2006  Job:  [id number]

## 2010-11-03 NOTE — Assessment & Plan Note (Signed)
DATE OF BIRTH:  10-22-1934   MEDICAL RECORD NUMBER:  [id number]   HISTORY:  A 76 year old male with past medical history significant for  hypertension and dyslipidemia was admitted to [facility] on December 09, 2003  with some falls and mental status changes, inability to walk, MRI showed  bihemispheric infarct, question cardioembolic, came to rehab from December 14, 2003 to December 23, 2003.  He was discharged at a modified independent level  ambulating 300 feet without an assistive device and was able to navigate two  flights of steps, independent with bathing/dressing.  He was seen by  neuropsychology, who felt that he had some decreased encoding, decreased  initiation thought to be related to his infarcts however, overall within age  limits except for some decreased visual memory and verbal memory encoding.   Patient went home, was independent but was then readmitted to [facility] in  August for another stroke.  He was seen by Dr. Delia Heady.  He had a  relatively normal neuro exam from a physical standpoint but did have some  slowness to respond to questions.  He did make some improvements however,  and was discharged home and lives alone.  He has not had any problems with  seizures.  He does not have any problems with falls.  He ambulates  independently without device, goes up and down steps, and dresses himself.   SOCIAL HISTORY:  As noted above, he is retired.   INTERVAL HISTORY:  Denies any new hospitalizations and looking through E-  Chart has not been hospitalized within the [facility] system since his discharge  following his previous stroke.   Has one medication change per his family doctor.   CURRENT MEDICATIONS:  Current meds include Norvasc, Lopressor, Zocor,  Coumadin, Catapres, Citrucel, Ultram, and Avapro.   ALLERGIES:  TYLENOL causing nausea.   EXAMINATION:  VITAL SIGNS:  Blood pressure 149/77, pulse 62, respirations  20, O2 saturation 99% room air.  GAIT:  He has  minimal gait imbalance with heal walking and toe walking and  but otherwise, can do normal gait without evidence of toe drag or knee  instability.  His Romberg is negative.  His finger-nose-to-finger testing is normal, he is able to do toe tapping  normal speeds bilaterally and is able to do rapid alternating movements,  bilateral supination/pronation normal speed.  He has normal fine motor with  finger to thumb opposition bilaterally.  He has normal strength bilateral  deltoids, biceps, triceps, grip, as well as hip flexion, knee extension, and  dorsiflexion.  His sensation normal bilaterally.  Cranial nerves II-XII are intact.  His  visual fields are intact to confrontation testing; his extraocular motions  are intact.  His deep tendon reflexes are hyperreflexic bilateral patellar  and left ankle, right ankle is reduced, he has 1+ reflexes bilateral biceps,  triceps, brachioradialis.  MENTAL STATUS:  Alert and oriented x3.  Normal latency of response.   IMPRESSION:  History of bilateral cortical and subcortical strokes presumed  cardioembolic stable on Coumadin.  He has minimal residual deficits, i.e.,  mild balance deficits that do not impede his functional status, he has no  significant visual or perceptual problems.  I feel that he is safe to drive  without a formal OT driving evaluation.   My recommendations were to start driving in a parking lot with another  licensed driver the first time out progressing to independent driving in low  congestion areas and only after comfortable proceed  with night driving and  driving in more congested areas.   I will see him back on a p.r.n. basis.  I have no other recommendations for  therapy as he has no therapy needs at this time.       AEK/MedQ  D:  04/06/2004 17:00:26  T:  04/06/2004 19:38:57  Job #:  [id number]   cc:   Tera Mater. Clent Ridges, M.D. [facility]

## 2010-11-03 NOTE — Consult Note (Signed)
Jacob Navarro, SCHULD NO.:  [id number]   MEDICAL RECORD NO.:  [id number]          PATIENT TYPE:  INP   LOCATION:  4732                         FACILITY:  [facility]   PHYSICIAN:  Antonietta Breach, M.D.  DATE OF BIRTH:  06-29-1934   DATE OF CONSULTATION:  DATE OF DISCHARGE:                                   CONSULTATION   REQUESTING PHYSICIAN:  Rene Paci.   REASON FOR CONSULTATION:  Capacity assessment, depression.   DATE OF CONSULTATION:  February 09, 2006.   HISTORY OF PRESENT ILLNESS:  Mr. Jacob Navarro is a 76 year old male admitted to  the [facility] on February 07, 2006 after a fall and the  development of chest pain.  Mr. Jacob Navarro has been developing short-term recall  problems in his memory.  He has been refusing skilled nursing placement  including during a recent admission in late May and June of this year.  He  has continued with noncompliance with medication as an outpatient.  He has  not been taking his Coumadin and now has developed a pulmonary embolism  requiring hospitalization.   His mood is mildly depressed.  He continues to express some interest in TV  sports.  There is no thoughts of harming himself or others.  No delusions.  No hallucinations.  He is noncombative and cooperative with care.   PAST PSYCHIATRIC HISTORY:  The patient has developed short-term recall  difficulty over the past several months.  He has been treated for depression  as an outpatient with Zoloft 20 mg q. day.   Back in June of 2005, the patient developed acute mental status changes,  these cleared in July of 2005 he was assessed to have 5 cerebral strokes in  the right cauda and subcortical as well as left cauda areas.  These were  assessed to possibly be secondary to a cardioembolic mechanism due to his  paroxysmal atrial fibrillation.  He was continued on Coumadin at that time  his daughter was laying out his medications for him daily.  In August of  2005 he  demonstrated some residual stroke deficits on mental status.  He had  slow speech and non-fluent speech, and was very slow to respond to  questions.  Memory difficulty was noted in September of 2006 in the medical  record.   The patient has no other psychiatric history.   FAMILY PSYCHIATRIC HISTORY:  Dementia.   SOCIAL HISTORY:  Marital status:  The patient is listed as widowed.  He has  2 children, a male and male.  His daughter has helped him with his  medication.  The patient is medically retired.  He went to 2 years of  college.  He denies any alcohol.  There is no illegal drug use.  He has been  living by himself.   GENERAL MEDICAL PROBLEMS:  Hypertension, cerebrovascular disease with a  history of cerebral vascular accidents as described above, atrial  fibrillation.   MEDICATIONS:  1. The MAR is reduced.  2. Psychotropics include Zoloft 50 mg q. day and Ambien 5 mg q.h.s. p.r.n.  insomnia.   ALLERGIES:  THE PATIENT IS ALLERGIC TO TYLENOL.   LABORATORY DATA:  CBC is unremarkable.  INR was 1.2 on admission.  Complete  metabolic panel showed a glucose elevated at 150, BUN 41, creatinine  2.8.,  the creatinine is stable.   Head CT without contrast on February 07, 2006 showed chronic ischemic changes  and no intracranial acute pathology.   REVIEW OF SYSTEMS:  CONSTITUTIONAL:  Afebrile.  Nose:  No rhinorrhea.  Mouth:  Red, no sore throat.  Ears:  There is a history recorded in the  chart of decreased hearing.  NEUROLOGICAL:  As above.  PSYCHIATRIC:  As  above.  CARDIOVASCULAR:  Hyperlipidemia, peripheral vascular disease, and  carotid disease.  RESPIRATORY:  No coughing or wheezing.  GASTROINTESTINAL:  Gastroesophageal reflux disease.  GENITOURINARY:  There is a history of  urinary tract infection.  He shows a stable increased creatinine of 2.6 and  2.8 on different days of this hospital  course.  SKIN:  Unremarkable.  ENDOCRINE/METABOLIC:  History of glucose intolerance,  hypothyroidism.  HEMATOLOGIC/LYMPHATIC: Unremarkable.  MUSCULOSKELETAL:  There is a history  of degenerative joint disease of the left knee.  Also a history of recurrent  falls with rapid myalgias.   PHYSICAL EXAMINATION:  VITAL SIGNS:  Temperature 97.8, pulse 94,  respirations 20, blood pressure 156/110, O2 saturation on 5 liters is 93%.  MENTAL STATUS EXAM:  Mr. Jacob Navarro is an alert, elderly male in a partially  reclined supine position in his hospital  bed with intermittent eye contact.  His fund of knowledge and intelligence are decreased below that of his  estimated premorbid baseline.  His mood is mildly depressed.  His affect is  mildly constricted.  On orientation testing he does not know the year, he  knows where he is at, he glances up at the calendar on the wall to give the  day of the month and the month, he does not know the day of the week.  He is  oriented to person.  His speech is mildly flat on probity.  He has normal  articulation.  On thought process he does delay in his answers reflecting  some difficulty with word-binding and memory.  Thought content:  No thoughts  of harming himself.  No thoughts of harming others.  No delusions.  No  hallucinations.  Memory Testing:  3/3 immediate, 0/3 at 5 minutes.  Concentration is mildly decreased.  Insight is poor judgment is impaired.   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 NOS.  The  patient has a significant number of criteria for dementia which is likely  due to cerebrovascular disease.  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  Axis II:  None.  Axis III:  See general medical problems.  Axis IV:  General medical.  Axis V:  40.   Mr. Jacob Navarro has critical deficits in memory as well as judgment.  He also  lacks the ability to appreciate the extinct of his deficits and the necessary supportive measures required.   Mr. Jacob Navarro does not have the capacity for informed consent.    RECOMMENDATION:  1. Concur with increasing his Zoloft to 50 mg q.a.m. and with further      increase it by 25 mg every 3 days as tolerated to 100 mg q.a.m.  2. Confirm that a reversible memory dysfunction etiology workup has been      completed including B12, folate, and thyroid function to confirm that  his Synthroid is adequately dosed.  3. Acetylcholinesterase inhibitors have also been utilized in      cerebrovascular dementia and there is some literature support for their      efficacy in memory in this population.  Would consider starting Aricept      5 mg q.h.s. for 1 month and if improvement seen, increase to 10 mg      q.h.s.  4. Concerning outpatient followup for his psychotropic medication, would      ask the case manager to contact one of the psychiatric clinics attached      to one of the local hospitals at [facility], [facility] or [facility] to setup psychiatric medication management and access.      Antonietta Breach, M.D.  Electronically Signed     JW/MEDQ  D:  02/09/2006  T:  02/09/2006  Job:  [id number]

## 2010-11-03 NOTE — H&P (Signed)
NAME:  Jacob Navarro, Jacob Navarro NO.:  [id number]   MEDICAL RECORD NO.:  [id number]          PATIENT TYPE:  EMS   LOCATION:  MAJO                         FACILITY:  [facility]   PHYSICIAN:  Jacob Navarro, M.D. LHCDATE OF BIRTH:  1934/12/19   DATE OF ADMISSION:  11/15/2005  DATE OF DISCHARGE:                                HISTORY & PHYSICAL   CHIEF COMPLAINT:  Fell and down over night with elevated CPK in the  emergency room.   HISTORY OF PRESENT ILLNESS:  Jacob Navarro is a 76 year old white male with  rapidly worsening weakness and fall x3 in the last 2 days. The first two  were witnessed by the daughter, minor and he was simply helped back to bed,  no injuries. The last one occurred approximately 9:30 p.m. yesterday after  the daughter went home for the night (he lives alone).  This happened again  at approximately 2 p.m. today and called for EMS to bring him to the ER. He  has some soreness of the rib cage bilaterally only, just above the abdominal  area, after lying on the floor over night with no other complaints. He  denies other symptoms prior to the fall or even after the fall. Just weak he  reports. The daughter's husband said he was active and walking about, doing  quite well several days ago but in the past he stopped his medications again  2 days ago.   PAST MEDICAL HISTORY:  1.  Hypertension.  2.  History of CVA both bilateral cortical and subcortical.  3.  History of paroxysmal atrial fibrillation.  4.  Hypercholesterolemia.  5.  Glucose intolerance.  6.  Degenerative joint disease in the left knee.  7.  Hard of hearing.  8.  History of urinary tract infection, E. coli.  9.  History of recurrent falls as noted above with rhabdomyolysis.  10. Peripheral vascular disease as well as left carotid disease.  11. Hypothyroidism.  12. Gastroesophageal reflux disease.  13. Noncompliance.   ALLERGIES:  No known drug allergies.   MEDICATIONS:  1.  Coumadin 10 mg  Monday, Wednesday, Friday and 7.5 mg the rest of the days      though it seems he did not take his medications yesterday and possibly      the day prior.  2.  Synthroid 0.1 mg p.o. daily.  3.  Diovan 160 mg p.o. daily.  4.  Norvasc 10 mg p.o. daily.  5.  Lopressor 100 mg b.i.d.  6.  Zocor 20 mg p.o. daily.  7.  Clonidine 0.1 mg b.i.d.  8.  Zoloft 25 mg p.o. q.h.s.  9.  Omeprazole 20 mg daily.   SOCIAL HISTORY:  No tobacco, no alcohol. Lives near the family alone. The  daughter checks on him daily.   FAMILY HISTORY:  Noncontributory.   PHYSICAL EXAMINATION:  VITAL SIGNS:  He is afebrile, blood pressure 151/81,  heart rate 77 to 104 and regular at the moment, respirations 20, O2  saturation is 96%.  HEENT EXAM:  PERLA. Head is atraumatic, normocephalic.  NECK:  No sign of JVD.  CHEST:  No rales or wheezes heard.  CARDIAC EXAM:  Regular rate and rhythm.  ABDOMEN:  Soft, nontender, positive bowel sounds.  EXTREMITIES:  No edema.   LABORATORY DATA:  White blood cell count 9600, hemoglobin 14.8.  INR of 1.9.  Electrolytes - potassium 3.1, BUN 17, creatinine 1.3, glucose 162. SGOT  elevated at 52, CPK 2057.  Head CT - no acute changes, old lacunar CVA.  Chest x-ray - no acute disease.   ASSESSMENT AND PLAN:  1.  Weakness post fall with elevated CPK in the setting of noncompliance. He      is unable to return home tonight due to issues, he has been admitted for      IV fluids.  2.  Renal impairment secondary to elevated CPK and possible rhabdomyolysis.  3.  Check telemetry, rule out myocardial event. Check 12-lead EKG. Restart      home medications.  Get PT to ambulate, also check head MRI, urinalysis and C&S to rule out  stroke and urinary tract infection.  1.  Hypokalemia. Replace p.o.  2.  Chronic Coumadin.  3.  Noncompliance issue may preclude him going home.           ______________________________  Jacob Navarro, M.D. [facility]     JWJ/MEDQ  D:  11/15/2005  T:  11/15/2005   Job:  [id number]   cc:   [name], M.D. [facility]  [address] [place]  [place] [address]   Pramod P. Pearlean Brownie, MD  Fax: [phone]

## 2010-11-03 NOTE — Discharge Summary (Signed)
NAME:  Jacob Navarro, Jacob Navarro NO.:  [id number]   MEDICAL RECORD NO.:  [id number]                   PATIENT TYPE:  INP   LOCATION:  3033                                 FACILITY:  [facility]   PHYSICIAN:  Rene Paci, M.D. [facility]          DATE OF BIRTH:  1935/05/24   DATE OF ADMISSION:  12/09/2003  DATE OF DISCHARGE:  12/14/2003                                 DISCHARGE SUMMARY   DISCHARGE DIAGNOSES:  1. Fall.  2. Acute mental status changes.  3. Bilateral cerebral infarct with left hemiparesis.  4. Supraventricular tachycardia.  5. Hypertension.  6. Hypokalemia.  7. Myositis.  8. Rhabdomyolysis.   HISTORY OF PRESENT ILLNESS:  Mr. Hyder is a 76 year old white male who fell  in the shower on the evening prior to admission.  He was found by his  daughter on the morning of admission lying on his back with evidence of dark  brown vomitus on his chest, neck and head.  The patient was confused.  The  patient was unable to get up on his own volition.  EMS was called.  The  patient was confabulating.   PAST MEDICAL HISTORY:  1. Hypertension.  2. Hypercholesterolemia.  3. Bilateral knee osteoarthritis.   HOSPITAL COURSE:  PROBLEM #1 -  NEUROLOGIC:  The patient presented with  acute mental status changes and a question of encephalopathy.  The patient  was admitted to rule out stroke versus hypertensive crisis versus  meningitis, versus alcohol withdrawal, versus thyroid disease, versus B12  deficiency, versus seizure disorder.  The patient was seen in consultation  by neurology.  Dr. Pearlean Brownie saw the patient.  The patient's work-up included an  MRI of the brain.  This revealed several acute nonhemorrhagic infarcts  superimposed upon atrophy, small vessel changes and old infarcts.  A 2-D  echo was unremarkable.  EEG revealed mild diffuse slowing consistent with  drowsiness versus mild encephalacia.  It was recommended a TEE be performed  to rule out a cardiogenic  source for embolic shower.  TEE was performed on  December 13, 2003, and was negative for vegetation.  The patient has been  started on heparin and Coumadin.  The patient has been seen in consultation  by physical therapy and physical medicine and rehab.  Inpatient rehab was  highly recommended.   PROBLEM #2 -  HYPERTENSION:  The patient presented with malignant  hypertension with a blood pressure of 216/132.  The patient's blood  pressures were aggressively targeted.  The patient had somewhat labile  hypertension but currently his blood pressures are overall well controlled  on the current regimen.   PROBLEM #3 -  MYOSITIS WITH SOME RHABDOMYOLYSIS:  The patient presented with  total CKs over 4000, likely secondary to his fall and then lying for a  prolonged period of time. The patient's total CKs have normalized.  The  patient's renal insufficiency improved with IV fluids.  PROBLEM #4 -  HYPERGLYCEMIA:  The patient had some hyperglycemia with normal  hemoglobin A1C.  The patient probably had some mild underlying glucose  intolerance.  CBGs currently well controlled on an ADA diet.   PROBLEM #5 -  HYPOKALEMIA:  The patient has been hypokalemic and we are  currently replacing his potassium.   PROBLEM #6 -  CARDIOVASCULAR:  The patient had a run of questionable SVT  versus paroxysmal atrial fibrillation with rapid ventricular response.  This  may have been the etiology of his bilateral cerebral infarcts.  Although, as  noted, the TEE did not show any evidence of vegetation.  Currently the  patient's rate is controlled and he is being anticoagulated.   DISPOSITION:  The patient is felt to be a good candidate for inpatient  rehab.   LABORATORY DATA AT DISCHARGE:  Hemoglobin 13.5.  Magnesium 1.9.  Potassium  3.  Hemoglobin A1C was 5.7%.  AST 62, total bilirubin 2.1.  Total  cholesterol 197, triglycerides 104, HDL 36, LDL 140.  Folate 9.8, B12 413.  TSH 2.279.   MEDICATIONS ON TRANSFER  TO REHAB:  1. Norvasc 10 mg daily.  2. Protonix 40 mg daily.  3. Lopressor 50 mg b.i.d.  4. Zocor 20 mg daily.  5. Coumadin and heparin per pharmacy.  6. Avapro 300 mg daily.  7. Thiamine 100 mg daily.   FOLLOW UP:  The patient should follow up with Dr. Clent Ridges after discharge from  rehab.      Cornell Barman, P.A. [facility]                  Rene Paci, M.D. [facility]    LC/MEDQ  D:  12/14/2003  T:  12/14/2003  Job:  [id number]   cc:   [name], M.D. [facility]   Pramod P. Pearlean Brownie, MD  Fax: [phone]

## 2010-11-03 NOTE — Discharge Summary (Signed)
NAMEDEWARD, Jacob Navarro                 ACCOUNT NO.:  [id number]   MEDICAL RECORD NO.:  [id number]          PATIENT TYPE:  INP   LOCATION:  4732                         FACILITY:  [facility]   PHYSICIAN:  Jacob Navarro, MDDATE OF BIRTH:  1935/02/08   DATE OF PROCEDURE:  DATE OF DISCHARGE:  02/14/2006                      STAT - MUST CHANGE TO CORRECT WORK TYPE   DISCHARGE DIAGNOSES:  1. Bilateral pulmonary embolism.  2. Hypokalemia.  3. Hypertension.  4. Elevated transaminases secondary to gallstones.  5. Episode of epistaxis secondary to anticoagulation, resolved.  6. Acute renal failure.  7. Paroxysmal atrial fibrillation.   HISTORY OF PRESENT ILLNESS:  Jacob Navarro is a 76 year old male admitted on  February 07, 2006 with complaints of fall and chest pain.  He sustained a fall  to the bottom of the stairs on the morning of admission and then apparently  laid there for 30 minutes until help arrived.  He did not remember falling  or hitting the ground.  The patient was admitted for further evaluation and  treatment.   PAST MEDICAL HISTORY:  1. Hypertension.  2. History of CVA, bilateral cortical and subcortical.  3. History of paroxysmal atrial fibrillation.  4. Dyslipidemia.  5. History of glucose intolerance.  6. Degenerative joint disease, left knee.  7. Hard of hearing.  8. History of UTI.  9. History of falls with rhabdomyolysis.  10.Peripheral vascular disease/carotid disease.  11.Hypothyroid.  12.GERD.  13.History of noncompliance.  14.History of gait abnormality.   COURSE OF HOSPITALIZATION:  1. Bilateral PE.  The patient was admitted and was noted to have left-      sided chest pain.  A VQ scan was done which was consistent with high      probability for bilateral PE.  He had apparently been noncompliant with      his prescribed Coumadin for atrial fibrillation, as his INR was found      to be subtherapeutic on admission.  He was started on IV heparin and      that  was later changed to low molecular weight heparin and he was      continued on Coumadin.  He is currently therapeutic with an INR today      of 2.  We will continue oxygen two liters nasal cannula as the      patient's oxygen saturation was noted to drop to 87% with ambulation      and is 91% on room air today.  2. Hypokalemia.  He was noted to have a low potassium level, which has      been repleted.  This will need continued monitoring.  3. Acute renal failure.  He was noted to have a creatinine on admission of      2.6.  The most recent creatinine at time of discharge is 1.4, which is      approaching his baseline.  4. Elevated transaminases.  The patient was noted to have elevated LFTs      and this was thought to be secondary to gallbladder stones which were      noted on ultrasound.  He was noted to have a normal common bile duct.      He had no GI symptoms or clinical evidence of infection.  As a result,      no further treatment or surgery was pursued, as the patient was just      recently diagnosed with PE.  5. Episode of epistaxis.  The patient was noted to have a nosebleed      earlier, which resolved with packing.  It was most likely exacerbated      by oxygen therapy in the setting of anticoagulation.  6. Incompetent per Psychiatry.  During this admission the patient was      evaluated by Dr. Antonietta Navarro of Psychiatry and it was felt that the      patient was not competent for informed consent.  7. The patient was also evaluated early in this admission by Cardiology      due to elevated cardiac enzymes and it was felt that these enzymes were      likely related to pulmonary embolus and it was recommended that the      patient be continued on anticoagulation  __________ series started.   PHYSICAL EXAMINATION:  VITAL SIGNS:  BP 169/107, heart rate 67, respiratory  rate 22, TEMP 97.6.  O2 sat 91% on room air.  GENERAL:  The patient is an elderly white male in no acute  distress.  CARDIOVASCULAR:  S1, S2.  Regular rate and rhythm.  LUNGS:  Clear to auscultation bilaterally, with no wheezes, rales or  rhonchi.  ABDOMEN:  Soft, nontender, nondistended.  Positive bowel sounds.  EXTREMITIES:  Show 2+ bilateral lower extremity edema.  NEURO:  The patient is awake, alert and oriented times three, moving all  extremities, noted to have mild left-sided facial weakness secondary to  stroke history.   PERTINENT LABORATORIES AT DISCHARGE:  INR 2.  BUN 19, creatinine 1.2, sodium  140, potassium 3.4.  AST 111, ALT 327.  TSH 5.42.   MEDICATIONS AT DISCHARGE:  1. Synthroid 100 mcg p.o. daily.  2. Norvasc 10 mg p.o. daily.  3. Protonix 40 mg p.o. daily.  4. Aspirin 325 mg p.o. daily.  5. Colace 100 mg p.o. at bedtime.  6. Multivitamin one tab p.o. daily.  7. Hydrochlorothiazide 25 mg p.o. daily.  8. Catapres 0.2 mg p.o. b.i.d.  9. Lopressor 100 mg p.o. b.i.d.  10.Zoloft 75 mg p.o. daily.  11.Coumadin 5 mg p.o. daily, to be adjusted based on INR.  INR today is 2.  12.Ambien 5 mg p.o. at bedtime p.r.n.  13.Nitroglycerin 0.4 mg sublingual every 5 minutes p.r.n.  14.Vicodin one to two tabs every four hours p.r.n.   DISPOSITION:  The plan is to transfer the patient to a skilled nursing  facility.  He is scheduled to go to the [facility] today.   DIET AT TIME OF DISCHARGE:  The patient will be maintained on a heart  healthy low sodium diet.   CONDITION AT TIME OF DISCHARGE:  Condition is stable.  He will be given his  morning blood pressure medications for slightly elevated blood pressure this  morning and will be transferred if blood pressure improves.     ______________________________  Jacob Craze, PA      Jacob A. Felicity Coyer, MD  Electronically Signed    MO/MEDQ  D:  02/14/2006  T:  02/14/2006  Job:  [id number]   cc:   Tera Mater. Clent Ridges, MD
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
## 2010-11-03 NOTE — Discharge Summary (Signed)
[name], Jacob Navarro NO.:  [id number]   MEDICAL RECORD NO.:  [id number]          PATIENT TYPE:  INP   LOCATION:  3736                         FACILITY:  [facility]   PHYSICIAN:  Jacob Navarro, M.D. LHCDATE OF BIRTH:  04-07-1935   DATE OF ADMISSION:  03/10/2005  DATE OF DISCHARGE:  03/11/2005                                 DISCHARGE SUMMARY   DISCHARGE DIAGNOSES:  1.  Profuse sweating episode for two hours unclear etiology.  2.  Pan sinusitis by CT.  3.  Cerebrovascular disease.  4.  History of cerebrovascular accident, bilateral cortical and subcortical.  5.  History of paroxysmal atrial fibrillation.  6.  Hypertension.  7.  Hypercholesterolemia.  8.  Mild glucose intolerance in the past.  9.  Degenerative joint disease.  10. Hard of hearing.  11. History of remote urinary tract infection July 2005, Escherichia coli.  12. History of recurrent falls.   PROCEDURE:  Head CT with old lacunar infarct, atrophy and bilateral  maxillary sinus disease.   CONSULTATIONS:  None.   HISTORY AND PHYSICAL:  See dictated day of admission March 10, 2005.   HOSPITAL COURSE:  Jacob Navarro is a very nice 76 year old white male here with  the above.  He had some memory difficulty and the history was difficult but  he stated he had a two-hour episode of diffuse sweating of unclear etiology.  He denies any sinus or other cardiovascular symptoms.  With his history of  such, it was felt best to admit to rule out MI.  Cardiac enzymes proved  negative.  Telemetry was normal.  He did have a low grade temperature at  99.2.  Other labs including UA were negative.  It was felt that since he was  ambulatory, eating well without further symptoms, it was felt he had gained  maximum benefits of hospitalization and is to be discharged home.  He did  well with his overnight Ceftin.   DISPOSITION:  Discharged to home in good condition.  No activity or dietary  restrictions except for low  sodium diet.   MEDICATIONS:  Same as on admission except for the addition of Ceftin 250 mg  p.o. b.i.d. for nine days.  Other medications to include:  1.  Coumadin 7.5 mg b.i.d.  2.  Norvasc 10 mg p.o. daily.  3.  Lopressor 50 mg b.i.d.  4.  Zocor 20 mg nightly  5.  Avapro unclear dosing.  6.  Catapres 0.2 mg b.i.d.  7.  Potassium supplement 20 mEq p.o. daily.   FOLLOW UP:  Dr. Tera Mater. Jacob Navarro, M.D., one week.  Consideration should be  made to pursue outpatient stress only Cardiolite to further evaluate cardiac  status as he states he has not had this in the past.           ______________________________  Jacob Navarro, M.D. Jacob Navarro     JWJ/MEDQ  D:  03/11/2005  T:  03/12/2005  Job:  [id number]   cc:   Jacob Navarro, M.D. [facility]  [address] [place]  [place] [address]

## 2010-11-03 NOTE — H&P (Signed)
NAMETAMEEM, PULLARA NO.:  [id number]   MEDICAL RECORD NO.:  [id number]          PATIENT TYPE:  EMS   LOCATION:  MAJO                         FACILITY:  [facility]   PHYSICIAN:  Unice Cobble, MD     DATE OF BIRTH:  04/30/1935   DATE OF ADMISSION:  02/07/2006  DATE OF DISCHARGE:                                HISTORY & PHYSICAL   PRIMARY CARE PHYSICIAN:  Tera Mater. Clent Ridges, M.D. at [facility].   CHIEF COMPLAINT:  Fall/chest pain.   HISTORY OF PRESENT ILLNESS:  The patient is a 76 year old white male with a  history of falls, rhabdomyolysis and medical noncompliance who was at home  today going down the stairs.  He felt weak for a period and sat down.  Upon  standing, he fell to the bottom of the stairs and laid there for  approximately 30 minutes until help arrived.  He does not remember falling  or hitting the ground.  He does not have any headaches, vision changes or  other aches in his neck or head.  He has no shortness of breath,  palpitations, orthopnea, PND, or edema.  He does have some mild chest pain  which he says is reproducible by pressing on his ribs or taking a deep  breath.  No history of seizures.  The patient admits to noncompliance with  medications and being mildly dehydrated due to being outside today.   PAST MEDICAL HISTORY:  1. Hypertension.  2. History of CVA both bilateral, cortical and subcortical.  3. History of paroxysm atrial fibrillation.  4. Hyperlipidemia.  5. Glucose intolerance.  6. Degenerative joint disease of the left knee.  7. Decreased hearing.  8. History of UTIs.  9. History of recurrent falls with rhabdomyolysis.  10.Peripheral vascular disease and carotid disease.  11.Hypothyroidism.  12.GERD.  13.Noncompliance.  14.History of gait abnormality.   ALLERGIES:  No known drug allergies.   MEDICATIONS:  1. Synthroid 100 mcg daily.  2. Diovan 320 mg daily.  3. Norvasc 10 mg daily.  4. Lopressor 100 mg b.i.d.  5.  Zoloft 20 mg daily.  6. Omeprazole 20 mg daily.  7. Clonidine 0.2 mg b.i.d.  8. Aspirin 325 mg daily.   SOCIAL HISTORY:  No tobacco, alcohol or drugs.  He lives near his family  alone.  Daughter checks on him.   FAMILY HISTORY:  Noncontributory.   REVIEW OF SYSTEMS:  A complete review of systems was performed and was found  to be negative except as mentioned in the HPI.   PHYSICAL EXAMINATION:  VITAL SIGNS:  His blood pressure is 157/110 with a  pulse of 114, respiratory rate is 16.  He is 94% on room air.  He is  afebrile.  GENERAL:  He is in no acute distress.  HEENT:  Shows moist mucus membranes with PERRLA and EOMI.  No bumps or  bruises on his head.  His neck is supple and nontender.  No bruits or  thyromegaly.  CHEST:  Clear to auscultation bilaterally.  CARDIOVASCULAR:  Regular rate and rhythm without murmurs, gallops,  or rubs.  He has a normal S1 and S2.  ABDOMEN:  Good bowel sounds.  Soft, nontender, and nondistended without  hepatosplenomegaly.  EXTREMITIES:  Warm and well perfused without clubbing, cyanosis, or edema.  CHEST:  His chest wall is tender when pressing on the left ribs from the  sternum to the mid-axillary line.  NEUROLOGICAL:  Cranial nerves II through XII intact.  He moves all  extremities appropriately.  No deficits noted.  Alert and oriented x3.   LABORATORY DATA:  His hemoglobin is 16.3.  His d-dimer is greater than 20.  Creatinine is 2.0 with a sodium of 137 and a potassium of 3.6.  Troponin is  0.11 with a CK-MB of 3.3.  His myoglobin is greater than 500.  Chest x-ray  reveals mild cardiomegaly with no acute cardiopulmonary disease.  EKG  reveals sinus tachycardia with left axis deviation, incomplete right bundle  branch block and nonspecific inferolateral ST and T-wave changes.   ASSESSMENT:  This is a 76 year old white male with a history of falls,  rhabdomyolysis and medical noncompliance who presents with a recurrent fall.   PLAN:  1.  Neurologically:  The patient has no head tenderness, headache or      deficits to indicate a bleed or stroke.  If this changes, he will need      a head CT.  2. Cardiovascular:  His noncompliance and his hypertension reflects it as      such.  We will give 10 mg of IV metoprolol now as per cardiology      recommendation and restart his home medications to prevent rebound      hypertension.  As he is on a number of different medications, it would      not be unreasonable to scan his kidneys for evidence of renal artery      stenosis at some point if this has not already been done.  We will      continue the Lopressor for his atrial fibrillation to prevent      paroxysms.  As for his increased troponin, it is uncertain whether this      is due to an actual myocardial infarction or a mild elevation secondary      to rhabdomyolysis or other abnormality including heart strain.  We will      check serial enzymes tonight as well as electrocardiograms to rule out      the possibility of myocardial infarction.  3. Pulmonary:  His d-dimer is elevated.  This is a very nonspecific      finding.  His O2 saturation are not significantly down and his chest      pain appears to be related solely to ribs.  We will go ahead and get      rib films tonight and if he is still having issues in the morning, we      will consider a VQ scan at that time (CT scan not needed secondary to      renal insufficiency).  4. Endocrine:  Check TSH and continue Synthroid.  5. Infectious disease:  Check urinalysis.  6. Depression:  Continue Zoloft.  7. Renal:  His creatinine is up. This may be secondary to dehydration.  We      will bolus one liter tonight and recheck in the morning.  Otherwise      consider rhabdomyolysis.  8. Prophylaxis:  Gastrointestinal and deep vein thrombosis.  ______________________________  Unice Cobble, MD    ACJ/MEDQ  D:  02/07/2006  T:  02/07/2006  Job:  [id number]

## 2010-11-03 NOTE — Consult Note (Signed)
NAME:  Jacob Navarro, Jacob Navarro                           ACCOUNT NO.:  [id number]   MEDICAL RECORD NO.:  [id number]                   PATIENT TYPE:  INP   LOCATION:                                       FACILITY:  [facility]   PHYSICIAN:  Pramod P. Pearlean Brownie, MD                 DATE OF BIRTH:  1935/01/04   DATE OF CONSULTATION:  DATE OF DISCHARGE:                                   CONSULTATION   REFERRING PHYSICIAN:  Rene Paci, M.D.   REASON FOR REFERRAL:  Stroke.   HISTORY OF PRESENT ILLNESS:  Jacob Navarro is a 76 year old male who was noted  by his daughter as having some alteration in his behavior since last  Saturday.  The patient was found to be slow to respond to questions and not  speaking as much spontaneously.  There was no headache, focal extremity  weakness, numbness, gait or balance difficulty noted.  Two days ago she  found him sitting on the toilet seat and not moving from there for several  hours.  He was brought to the hospital and admitted on February 01, 2004.  He  had no focal neurological deficits.  CT scan of the head was performed in  the emergency room which showed old left basal ganglia lacunar infarction  with possible extension.  The patient had been started on Coumadin recently  for bihemispheric strokes with questionable episode of atrial fibrillation.  However, his INR on admission was only 1.1.  He was also recently seen by  his primary care physician and his blood pressure was found to be high and  medications were changed.   PAST MEDICAL HISTORY:  Significant for episode of confusion and fall at home  in June 2004.  At that time evaluation revealed bihemispheric small  infarctions with stroke workup including transesophageal echocardiogram  which did not show any obvious cardiogenic source of embolus; however, the  MRI scan showed bihemispheric acute infarction raising possibility of  embolus.  Also significant for hypertension, hyperlipidemia, paroxysmal  atrial  fibrillation/supraventricular tachycardia.   HOME MEDICATIONS:  1. Coumadin 6 mg daily.  2. Multivitamin once daily.  3. Norvasc once daily.  4. Lopressor 50 b.i.d.  5. Zocor 20 mg daily.  6. Avapro 300 mg daily.  7. Clonidine 0.2 mg b.i.d.  8. Diovan recently started.   ALLERGIES:  None.   FAMILY HISTORY:  Mother had Alzheimer's.  No history of strokes.   SOCIAL HISTORY:  The patient lives alone.  He does not smoke or drink.  His  family lives nearby and is supportive.  The daughter lays his medications  out for him every day.   REVIEW OF SYSTEMS:  Not significant for recent headache, fever, nausea,  vomiting, chest pain, shortness of breath.  Significant for confusion,  speech and language difficulties recently.   PHYSICAL EXAMINATION:  GENERAL:  Pleasant, elderly gentleman not in  distress, sitting up comfortably in a chair.  VITAL SIGNS:  He is afebrile.  Pulse rate is 60 per minute, regular,  respiratory rate 20 per minute, blood pressure 172/98, saturations 98% on  room air.  Distal pulses are well felt.  HEENT:  Nontraumatic.  ENT:  Unremarkable.  NECK:  Supple without bruit.  CARDIAC:  No murmur or gallop.  LUNGS:  Clear to auscultation.  NEUROLOGIC:  The patient is awake, alert, oriented x3 with normal speech and  language function.  There is no aphasia, dyspraxia, or dysarthria.  Pupils  are regular, reactive to light and accommodation.  Visual acuity appears  adequate.  Face is symmetric.  Palate elevates normally.  Tongue is midline.  Motor system examination reveals symmetric upper and lower strength, tone,  reflexes, coordination, sensation.  The patient's speech is slow and  nonfluent.  There is no slurred speech.  He can name and repeat quite well,  but is very slow to respond to questions.  His reaction time to answer  questions is quite prolonged but when he does respond it is appropriate.  His gait is steady.  He is slightly unsteady when standing on the  right foot  unsupported.  There is mild weakness of the right foot.  Ankle dorsiflexor  seen.   DATA REVIEWED:  CT scan of the head done on February 01, 2004 reveals subtle  hypodensity in the left __________ to remote left basal ganglia infarct.  MRI scan of the brain done yesterday showed he has acute left genu of  internal capsule/thalamic infarction with the older lacunar infarcts both  basal ganglia, extensive small vessel __________ changes.  MRA of the entire  cranial circulation reveals what appears to be a high grade left __________  carotid artery stenosis.  As compared with the previous MRA from December 10, 2003, this appears to be slightly worse.  MRI from December 10, 2003 reveals  acute bihemispheric infarction on both basal ganglia and white parietal  subcortical white matter.  INR on admission at this time is suboptimal at  1.1.   IMPRESSION:  A 76 year old gentleman with significant multiple vascular risk  factors of hypertension, diabetes, hyperlipidemia, and recent strokes.  Presents with worsening mental status with MRI scan showing new left  subcortical acute infarction.  He certainly has significant intracranial  left internal carotid artery stenosis which may have occluded to the present  infarct with his recent infarcts in June were clearly bihemispheric favoring  a cardiac source.   PLAN:  I agree with anticoagulation with heparin and Warfarin with INR goal  of 2-3.  If the patient fails maximum anticoagulation with Warfarin and has  recurring symptoms of focal left hemispheric ischemia, may consider  endovascular treatment for left ICA stenosis.  I had a long discussion with  the patient and his daughter about the treatment plan and answered  questions.  Maximize the risk factor modification with diet control of blood  pressure with systolic blood pressure goal below 130 and hyperlipidemia with  LDL goal below 100 and I will be happy to call the patient in  consult. Kindly call for questions.                                               Pramod P. Pearlean Brownie, MD    PPS/MEDQ  D:  02/03/2004  T:  02/04/2004  Job:  [id number]

## 2010-11-03 NOTE — Discharge Summary (Signed)
NAME:  Jacob Navarro, Jacob Navarro NO.:  [id number]   MEDICAL RECORD NO.:  [id number]                   PATIENT TYPE:  IPS   LOCATION:  4034                                 FACILITY:  [facility]   PHYSICIAN:  Erick Colace, M.D.           DATE OF BIRTH:  May 23, 1935   DATE OF ADMISSION:  12/14/2003  DATE OF DISCHARGE:  12/23/2003                                 DISCHARGE SUMMARY   DISCHARGE DIAGNOSES:  1. Bi-cerebral strokes in right caudate and subcortical and left caudate     areas, question cardioembolic secondary to paroxysmal atrial     fibrillation.  2. Episode of paroxysmal atrial fibrillation, resolved.  3. Hypertension.  4. Dyslipidemia.  5. Rhabdomyolysis.  6. Episode of heme positive stool.  7. Degenerative disk disease.   HISTORY OF PRESENT ILLNESS:  Mr. Tippen is a 76 year old male with history  of hypertension, dyslipidemia, admitted to [facility] December 09, 2003, with falls and mental status changes with inability to walk.  He had  been on floor for two days prior to being found by family and admitted to  the hospital.  At the time of admission, he was noted to have elevated  WBC's, abnormal LFT's and question of rhabdomyolysis.  He was treated with  IV fluids.  His CK was at 4416 with MB of 27.5 and myoglobin at 19.  A MRI  of the brain showed bihemispheric infarct, question cardioembolic.  He was  placed on IV heparin.  A 2D echo showed EF of 55-65% no source of emboli.  PE was normal.  Showed normal LVH.  No LA thrombus.  Negative saline  __________ agitation study.  He was noted to have an episode of PAF and  Coumadin was initiated for CVA prophylaxis.  He was also started on beta  blocker for rate control.  Therapy was initiated, and the patient was noted  to be minimal assistance for transfers.  Minimal assistance for ambulating  25 feet with rolling walker.  Rehab was consulted for progress of  independence goals.   PAST MEDICAL  HISTORY:  1. OA bilateral knees.  2. Hypertension.  3. Dyslipidemia.  4. Decreased hearing.   FAMILY HISTORY:  Positive for AAA.   SOCIAL HISTORY:  The patient is widowed, lives alone in two level home with  eight steps at entry, was independent active prior to admission.  Does not  use any tobacco or alcohol.   HOSPITAL COURSE:  Mr. Tri Chittick was admitted to rehab on December 14, 2003,  for inpatient therapies to consist of PT/OT daily.  Past admission labs done  showed hemoglobin 13.2, hematocrit 37.0, white count 8.2, platelets 301,000.  Sodium 140, potassium 3.1, chloride 105, CO2 28, BUN 13, creatinine 1.2,  glucose 128.  The patient's hypokalemia was supplemented and recheck labs of  December 17, 2003, showed a potassium to have improved at 3.8.  His abnormal  LFT's were followed on and showed some improvement with AST 45, ALT 55,  total bilirubin 1.3.  The patient was maintained on Coumadin throughout his  stay.  His INR was therapeutic in 2-3 range on day of discharge on December 23, 2003.  His INR was noted to jump to 4.2.  His Coumadin is to be held for the  next two days and to be resumed at 5 mg per day.  He has home health nurse  to draw pro time on Monday July 11, with results of Dr. Clent Ridges.  Dr.  Clent Ridges had  graciously agreed to follow and adjust the patient's Coumadin past  discharge.  Also, of note, the patient was noted to have E. coli UTI with  urgency and incontinence.  This was treated with Amoxicillin.  The patient  was also noted to have episodic bowel incontinence secondary to urgency and  was started on Metamucil with resolution of bowel incontinence.  At the time  of discharge, bowel and bladder functioning without issues.  He has also  made good progress during his stay.  Ataxia is improving.  He is currently  at independent level for bed mobility, modified independent for transfers,  modified independent for ambulating 300 feet x3 without assisted device.  He  is to have  superversion for navigating two flights of stairs.  He is  modified independent at sit to stand level for bathing and dressing and  during toileting.  He is able to perform simple medium preparation tasks  without difficulty.  Therapies did indicate some problems with some concerns  regarding the patient's cognition.  Dr. Leonides Cave, neuropsych, was consulted  for input and cognitive evaluation.  He felt the patient was noted to have  decreasing encoding and decreasing initiation that could be secondary to  disruption of white matter tracks secondary to bi-cerebral infarcts.  He  also felt that the patient's cognitive evaluation was within normal limits  for his age with the exception of problems encoding new verbal or visual  information to memory state.  His language skills, spatial assembly, delayed  retention and verbal reasoning within normal limits.  It was recommended  that the patient ask for instructions to be repeated or reshown until he had  fully encoded the information.  He would also need to learn new information  in smaller amounts.  The patient will continue with follow up Home Health,  PT/OT by [facility] past discharge.  Home Health hiring has been  arranged for pro time draws.  On December 23, 2003, the patient is discharged to  home.   DISCHARGE MEDICATIONS:  1. Coumadin to be resumed at 5 mg daily starting December 25, 2003.  2. Multivitamin one per day.  3. Norvasc 10 mg daily.  4. Lopressor 50 mg b.i.d.  5. Zocor 20 mg q.h.s.  6. Avapro 20 mg per day.  7. Citrucel one tablespoon daily.  8. Catapres 0.2 mg b.i.d.  9. Ultram 50 mg t.i.d. p.r.n.   ACTIVITY:  Intermittent supervision for now.   SPECIAL INSTRUCTIONS:  No alcohol, no smoking, no driving.  May use Tylenol  p.r.n. pain.  No aspirin, ibuprofen or Aleve.  [facility] to provide  PT/OT, RN.   FOLLOW UP:  Patient to follow up with Dr. Clent Ridges in 2 weeks.  Follow up with  Dr. Wynn Banker August 18 at 12:20  p.m.      Pamela Panchikal, P.A.  Erick Colace, M.D.    PP/MEDQ  D:  12/23/2003  T:  12/24/2003  Job:  [id number]   cc:   [name], M.D. [facility]   Pramod P. Pearlean Brownie, MD  Fax: [phone]

## 2010-11-03 NOTE — Procedures (Signed)
ELECTROENCEPHALOGRAM NUMBER:  [id number].   CLINICAL HISTORY:  This is a routine EEG done with photic, but  hyperventilation.  The patient is described as an awake and alert 76-year-  old man with confusion.  The EEG is for evaluation.   DESCRIPTION:  The predominant rhythm of this tracing is a moderate amplitude  theta alpha rhythm of 7-8 Hz which predominates posteriorly.  It appears  without abnormal asymmetry and attenuates with eye opening and closing.  Low-  amplitude __________ activity is seen frontally and centrally and appears  without abnormal asymmetry.  A fair amount of muscle artifact is present.  No focal slowing is normal and no epileptiform discharges are seen.  The  patient remained in the awake state throughout the recording.  Photic  stimulation produced little change in the background rhythms.  Hyperventilation was not performed.  A single channel devoted to EKG  revealed sinus rhythm throughout with rate of approximately 80 beats per  minute.   CONCLUSIONS:  Abnormal phase due to the presence of mild slowing of the  background rhythms.  Findings suggestive of diffuse widespread cerebral  dysfunction and consistent with a given history of drowsy and/or  encephalopathic state.  No focal abnormalities are noted.  No epileptiform  discharges are seen.    Fenton Malling, M.D.   UYQ:IHKV  D:  12/10/2003 19:26:40  T:  12/11/2003 09:51:38  Job #:  [id number]

## 2010-11-03 NOTE — Consult Note (Signed)
NAMEMURICE, BARBAR NO.:  [id number]   MEDICAL RECORD NO.:  [id number]          PATIENT TYPE:  INP   LOCATION:  4732                         FACILITY:  [facility]   PHYSICIAN:  Antonietta Breach, M.D.  DATE OF BIRTH:  1934-12-21   DATE OF CONSULTATION:  DATE OF DISCHARGE:                                   CONSULTATION   FOLLOWUP PSYCHIATRIC CONSULTATION   DATE OF CONSULTATION:  February 11, 2006.   HISTORY OF PRESENT ILLNESS:  Mr. Jaggers still has short-term recall deficits  however, he is able to give some history regarding his depression.  He  recognizes that he has had a number of months of depression with poor  motivation as well as having crying spells.  He is not able to concentrate  well.   He is not having any adverse psychotropic effects.  He is cooperative with  care.  His judgment remains impaired.   EXAMINATION:  VITAL SIGNS:  Temperature 98.0, pulse 51, respiration 22,  blood pressure 167/79, O2 saturation 92% on room air.  MENTAL STATUS EXAM:  On exam, the patient is oriented to the place and the  year, he continues to be oriented to person.  His memory continues with the  same impairment on short-term.  He still has impaired judgment.  The patient  continues to have difficulty with word finding.   ASSESSMENT:  294.9, unspecified persistent mental disorder, NOS with  cognitive and memory as well as judgment deficits.  Mood disorder, not otherwise specified:  293.83 (depressive symptoms  associated with general medical condition).   RECOMMENDATION:  Titrate Zoloft to 75 mg q. day for anti-depression, Ambien  5 mg q.h.s. p.r.n. insomnia.  Consider the benefit of one of the acetylcholinesterase inhibitors such as  Aricept starting at 5 mg q.h.s.      Antonietta Breach, M.D.  Electronically Signed     JW/MEDQ  D:  02/12/2006  T:  02/12/2006  Job:  [id number]

## 2010-11-03 NOTE — Consult Note (Signed)
NAME:  LARRI, YEHLE                           ACCOUNT NO.:  [id number]   MEDICAL RECORD NO.:  [id number]                   PATIENT TYPE:  INP   LOCATION:  1823                                 FACILITY:  [facility]   PHYSICIAN:  Pramod P. Pearlean Brownie, MD                 DATE OF BIRTH:  01/15/1935   DATE OF CONSULTATION:  DATE OF DISCHARGE:                                   CONSULTATION   CONSULTING PHYSICIAN:  Pramod P. Pearlean Brownie, M.D.   REQUESTING PHYSICIAN:  Rene Paci, M.D. [facility].   REASON FOR REFERRAL:  Confusion.   HISTORY OF PRESENT ILLNESS:  Mr. Edmonston is a 76 year old Caucasian male who  was found at his home by his daughter in the shower.  He had a bruise on his  back.  The patient was apparently there for a long period of time.  His  daughter last spoke to him on Monday evening, and he was fine at that time.  The lights were on in the home when she got there.  When she called out for  him, her father was able to call her and tell her where he was.  He has been  confused since then and was saying that he had been to the [facility] yesterday in the emergency room but the doctors did not see him.  However, it was clear that the patient had not left the home.  He was found  to be moving all four extremities.  There was on tongue bite or any  incontinence of urine or stools noted.  He has no prior history of strokes,  or TIAs, or significant neurological problems.   PAST MEDICAL HISTORY:  1. Hypertension.  2. Hyperlipidemia.  3. Osteoarthritis involving his knees.   HOME MEDICATIONS:  Toprol XL, Norvasc, Diovan, Lozol, Lipitor, potassium.   FAMILY HISTORY:  Not significant for anybody with neurological problems,  except his Mom who had dementia.   SOCIAL HISTORY:  The patient is a widower and lives alone.  He used to work  for [employer].  He does not smoke or drink.   MEDICATION ALLERGIES:  None.   REVIEW OF SYSTEMS:  Not significant for any recent fevers, loss of  weight,  cough, chest pain, diarrhea, headache.   PHYSICAL EXAMINATION:  GENERAL:  Reveals an elderly gentleman who is not in  distress.  VITAL SIGNS:  He is afebrile.  Pulse rate is 70 per minute regular,  respirations 16 per minute, blood pressure, on admission was 216/132.  It is  now 170/80.  Distal pulses are well felt.  HEENT:  Head is atraumatic.  ENT exam unremarkable.  NECK:  Supple without bruit.  CARDIAC:  No murmur or gallop.  LUNGS:  Clear to auscultation.  NEUROLOGIC:  The patient is awake, alert.  He is oriented to person and  place but not to time.  He knows the  President and can follow three-step  commands.  There is no aphasia, apraxia, dysarthria.  His thought processes  seem to be slow, but he can name, repeat, and comprehend quite well.  He did  not appear to confabulate any information during my exam.  There is no  distractibility noted.  Eye movements are full range without nystagmus.  Face is symmetric.  Palate elevates normally.  Tongue is midline.  Visual  acuity and fields seem adequate.  There is a slight left facial asymmetry  noted.  Motor system exam is with symmetric upper extremities without any  drift.  He has mild weakness of finger tapping on the left.  The left grip  is minimally weak.  However, note there is an IV at the left wrist.  He has  mild weakness of the left hip flexors, but he does have significant knee  pain.  Foot tapping is slow on the slow on the left compared to the right.  Deep tendon reflexes are 2+ symmetric.  Both plantars are downgoing.  He has  intact touch, pinprick position sensation.  Coordination is slightly  impaired on the left, finger-to-nose.  Position sense is preserved  bilaterally.   DATA REVIEWED:  Non-contrast CAT scan of the head done today reveals no  acute abnormalities.  The white matter has __________ changes noted.  A  questionable left lacunar infarction is seen in the left cortical white  matter, the  frontal region.   ADMISSION LABS:  Pending.   IMPRESSION:  A 76 year old male with an episode of an un-witnessed fall at  home with some confusion and questionable left sided weakness.  Perhaps he  may have had a small right hemispheric infarction or an un-witnessed seizure  with postictal confusion.  Hypertensive encephalopathy is another  possibility given his significant elevated blood pressure.   PLAN:  I would recommend obtaining an MRI scan of the brain to look for any  right hemispheric infarction or changes of hypertensive encephalopathy.  Treat his blood pressure and keep systolic blood pressure around 150-160.  Further workup would depend upon the brain imaging findings.  I would be  happy to follow the patient on consult.  I had a long discussion with the  patient's daughter and son about his condition and answered questions.                                               Pramod P. Pearlean Brownie, MD    PPS/MEDQ  D:  12/09/2003  T:  12/11/2003  Job:  [id number]   cc:   Rene Paci, M.D. [facility]  [address] [place], [place] [address]

## 2010-11-03 NOTE — H&P (Signed)
NAMEMAJID, MCCRAVY NO.:  [id number]   MEDICAL RECORD NO.:  [id number]          PATIENT TYPE:  INP   LOCATION:  3736                         FACILITY:  [facility]   PHYSICIAN:  Corwin Levins, M.D. LHCDATE OF BIRTH:  1934/09/28   DATE OF ADMISSION:  03/10/2005  DATE OF DISCHARGE:                                HISTORY & PHYSICAL   CHIEF COMPLAINT:  I broke out in a sweat this morning after I woke up and  some weakness thereafter.  It seemed to go away after about two hours.  Found to have slightly abnormal ECG in the ER.  Now for admission for  further evaluation and management.   HISTORY OF PRESENT ILLNESS:  This 76 year old white male is here for the  above this morning with no problems since that time.  He did see Dr. Abran Cantor  earlier it seems this week with problems difficulty urinating.  He did try  to urinate this morning when he was here in the ER, but really did not need  to, and has not been able to provide a specimen.  He seems to have some at  least mild confusion, however, it is not clear if this history is accurate.  He denies he has had a stress echocardiogram for cardiac concerns in the  past.   PAST MEDICAL HISTORY:  1.  Hypertension.  2.  Hypercholesterolemia.  3.  Questionable glucose intolerance.  4.  History of CVA, bilateral cortical and subcortical, followed by Dr.      Pearlean Brownie.  5.  History of paroxysmal atrial fibrillation/SVT in the past, now on      chronic Coumadin, a rather high dose.  6.  Peripheral vascular disease with known left carotid disease.  7.  History of rhabdomyolysis with recurrent falls.  8.  History of DJD of the knee.  9.  Hard of hearing.  10. History of urinary tract infection, July 2005, E. coli.   ALLERGIES:  No known drug allergies.   CURRENT MEDICATIONS:  1.  Multivitamin one daily.  2.  Coumadin 7.5 mg b.i.d.  3.  Norvasc 10 mg p.o. daily.  4.  Lopressor 50 mg b.i.d.  5.  Zocor 20 mg q.h.s.  6.  Avapro,  unknown dose, one daily.  7.  Catapres 0.2 mg b.i.d.  8.  Potassium supplementation 20 mEq daily.   SOCIAL HISTORY:  No tobacco, no alcohol.  Lives near the family and has a  daughter who gives him his medications on a daily basis.   FAMILY HISTORY:  Dementia and peripheral vascular disease.   REVIEW OF SYSTEMS:  Otherwise noncontributory.   PHYSICAL EXAMINATION:  VITAL SIGNS:  O2 saturation 98% on room air,  respirations 20, pulse 69, afebrile, blood pressure 152/84.  HEENT:  Sclerae clear.  TM's clear.  Pharynx with mild erythema.  Sinuses  nontender.  NECK:  Without lymphadenopathy, JVD, thyromegaly.  CHEST:  No rales or wheezing.  CARDIAC:  Regular rate and rhythm, no murmur.  ABDOMEN:  Soft, nontender, positive bowel sounds, negative for masses.  EXTREMITIES:  No edema.  LABORATORY DATA:  White blood cell count 10.4, hemoglobin 14, platelets 239.  CPK-MB less than 0.01, troponin-I less than 0.05.  INR 2.4.  Alcohol less  than 5.  Head CT scan no acute problems, old lacunar infarct's noted and  atrophy.  There is bilateral maxillary sinus disease, unclear acute or  chronic.  Chest x-ray no acute disease.  ECG from 2005, showed sinus  tachycardia, questionable LVH, nonspecific ST-T wave changes.  Today's ECG,  March 10, 2005, shows sinus with questionable ST segment depression in  2, 3, and aVF, versus nonspecific ST-T wave changes, it is difficult to  compare to previous due to the tachycardic nature.   ASSESSMENT AND PLAN:  1.  Profuse sweats for two hours this morning, no other clear signs or      symptoms.  He had some difficulty with memory;  however, there are no      other obvious problems except _____________on CT scan and a questionable      electrocardiogram changes.  He is to be admitted to rule out myocardial      infarction.  If cardiac enzymes are negative, would recommend outpatient      stress Cardiolite.  2.  Sinusitis.  Difficult to note acute or  chronic or both.  There are no      other signs or symptoms except for possible sweats this morning.  Will      start ________________b.i.d. due to the memory difficulty and difficulty      with history.  3.  Cerebrovascular disease, otherwise stable.  4.  Cardiovascular disease.  On Coumadin high dose, will get pharmacy to      follow.  5.  Questionable genitourinary problems.  Check urinalysis culture and      sensitivity with history of urinary tract infection.  6.  Memory difficulty.  Suspect at least mild dementia related to prior      cerebrovascular accident, but not defined in the past.           ______________________________  Corwin Levins, M.D. [facility]     JWJ/MEDQ  D:  03/10/2005  T:  03/11/2005  Job:  [id number]   cc:   [name], M.D. [facility]  [address] [place]  [place] [address]

## 2010-11-03 NOTE — Discharge Summary (Signed)
NAME:  ANCIL, DEWAN NO.:  [id number]   MEDICAL RECORD NO.:  [id number]                   PATIENT TYPE:  REC   LOCATION:  TPC                                  FACILITY:  [facility]   PHYSICIAN:  Rene Paci, M.D. [facility]          DATE OF BIRTH:  07-Mar-1935   DATE OF ADMISSION:  02/01/2004  DATE OF DISCHARGE:  02/09/2004                                 DISCHARGE SUMMARY   DISCHARGE DIAGNOSES:  1. Acute left subcortical infarct.  2. Left internal carotid artery stenosis.  3. Speech and cognitive defects.  4. Mild glucose intolerance.  5. Hypertension.   BRIEF ADMISSION HISTORY:  Mr. Devincenzi is a 76 year old white male who had  bilateral cerebral infarcts in June 2005.  This was presumed to be  cardiogenic secondary to aflutter, although, his workup at that time was  negative for any source of embolus.  He was seen in the emergency department  on the day of admission.  He was found by his daughter at home on the  toilet.  He was confused and not acting right.  He had seen his primary care  physician the week before and was felt to be very depressed.  He had not  been able to get his driver's license renewed.   PAST MEDICAL HISTORY:  1. Cerebrovascular accident in June 2005.  2. Hypertension.  3. Hypercholesterolemia.  4. SVT MPAS.  5. Chronic anticoagulation.   HOSPITAL COURSE:  NEUROLOGICAL:  The patient has a history of bilateral  infarcts in June 2005, who presented with evidence of recurrent CVA.  MRI  did reveal an acute lateral left thalamus infarct likely secondary to a left  ICA intracranial stenosis.  It was recommended the patient be started on  heparin and Coumadin for a goal INR between 2-3.  Dr. Pearlean Brownie had seen the  patient and recommended left internal carotid artery revascularization if  left hemispheric ischemia symptoms  persisted despite a therapeutic INR.  Otherwise, there was no indication for further stroke workup.  The patient  had had an extensive stroke evaluation during his June 2005 admission.  The  patient was evaluated by Speech and Language Pathology who noted moderate to  severe cognitive linguistic deficits in the area of memory, problem solving,  attention, initiation.  He also had moderately decreased speech initiation  and often no response to questions.  They made recommendations for 24-hour  supervision.  These findings were discussed with the daughter who requested  nursing home placement.  Arrangements have been made for nursing home  placement.   DISCHARGE PHYSICAL EXAMINATION:  VITAL SIGNS:  Temperature 99.1, blood  pressure 168/91, heart rate 68, respirations 20, CBG 119.  GENERAL APPEARANCE:  This is a well-developed, elderly white male in no  acute distress.  HEENT:  Unremarkable.  NECK:  Without carotid bruits, JVD or adenopathy.  LUNGS:  Clear without wheezes, rales or rhonchi.  CARDIOVASCULAR:  Irregular.  ABDOMEN:  Bowel sounds are present, soft and nontender.  EXTREMITIES:  Without edema.  NEUROLOGICAL:  The patient is alert with no focal muscle strength deficits.   DISCHARGE LABORATORIES:  Pro time 18.9, INR 1.9.  CBC normal.   DISCHARGE MEDICATIONS:  1. Norvasc 10 mg daily.  2. Lopressor 50 mg b.i.d.  3. Clonidine 0.2 mg b.i.d.  4. Zocor 20 mg daily.  5. Zoloft 50 mg q.h.s.  6. Protonix 40 mg daily.  7. Multivitamin with minerals daily.  8. Diovan 160 mg b.i.d.  9. Lovenox 60 mg subcu q.12h for five more days.  10.      Coumadin 7.5 mg daily x3 days and then decreased to 6 mg daily.   FOLLOWUP:  The patient needs a pro time drawn on Monday, August 29.  Follow  up with Dr. Clent Ridges as needed.      Cornell Barman, P.A. [facility]                  Rene Paci, M.D. [facility]    LC/MEDQ  D:  02/09/2004  T:  02/09/2004  Job:  [id number]   cc:   Pramod P. Pearlean Brownie, MD  Fax: [phone]   Tera Mater. Clent Ridges, M.D. [facility]

## 2010-11-16 ENCOUNTER — Other Ambulatory Visit: Payer: Self-pay | Admitting: Family Medicine

## 2011-03-30 LAB — I-STAT 8, (EC8 V) (CONVERTED LAB)
Glucose, Bld: 118 — ABNORMAL HIGH
Hemoglobin: 13.3
Potassium: 3.2 — ABNORMAL LOW
Sodium: 140
TCO2: 28

## 2011-03-30 LAB — URINALYSIS, ROUTINE W REFLEX MICROSCOPIC
Bilirubin Urine: NEGATIVE
Ketones, ur: NEGATIVE
Nitrite: NEGATIVE
Protein, ur: NEGATIVE
Urobilinogen, UA: 1

## 2011-03-30 LAB — PROTIME-INR
INR: 1.1
Prothrombin Time: 14.3

## 2011-03-30 LAB — DIFFERENTIAL
Basophils Relative: 0
Eosinophils Absolute: 0.2
Monocytes Relative: 5
Neutrophils Relative %: 70

## 2011-03-30 LAB — POCT CARDIAC MARKERS
CKMB, poc: <1 — ABNORMAL LOW
Operator id: 196461

## 2011-03-30 LAB — CK: Total CK: 82

## 2011-03-30 LAB — CBC
MCHC: 34.5
Platelets: 265
RDW: 13

## 2011-04-10 IMAGING — CT CT ANGIO CHEST
2 of 7 series · 18 of 36 positions shown · IV contrast (agent unspecified)
Comparison: None.

CLINICAL DATA: Syncope.  Weakness.

CT ANGIOGRAPHY CHEST WITH CONTRAST
TECHNIQUE: Multidetector CT imaging of the chest was performed
using the standard protocol during bolus administration of
intravenous contrast.  Multiplanar CT image reconstructions
including MIPs were obtained to evaluate the vascular anatomy.
Contrast:  75 ml [id number]-RRR

[Series 5: pe thins · axial · 0.74mm/px · z∈[-402,-141]mm · 17 of 295 slices shown]
[im 17/295  lung]
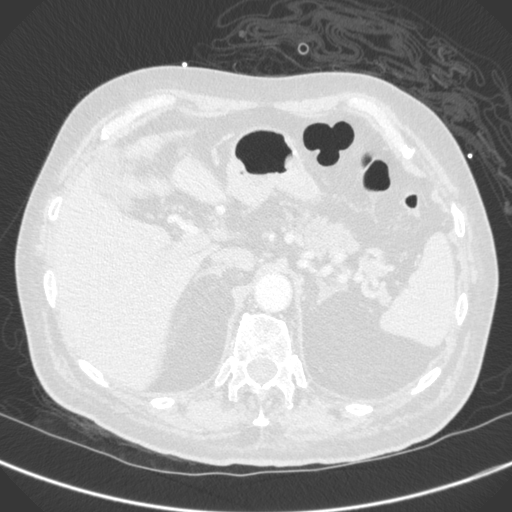
[im 33/295  mediastinal]
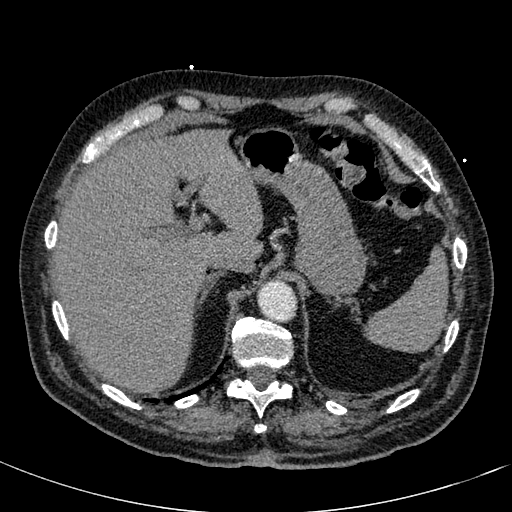
[im 50/295  lung]
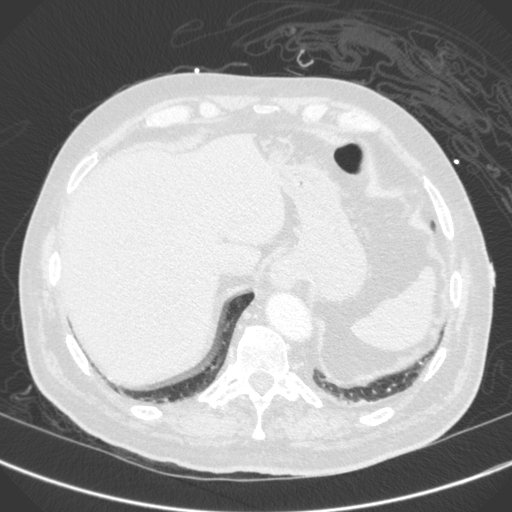
[im 66/295  mediastinal]
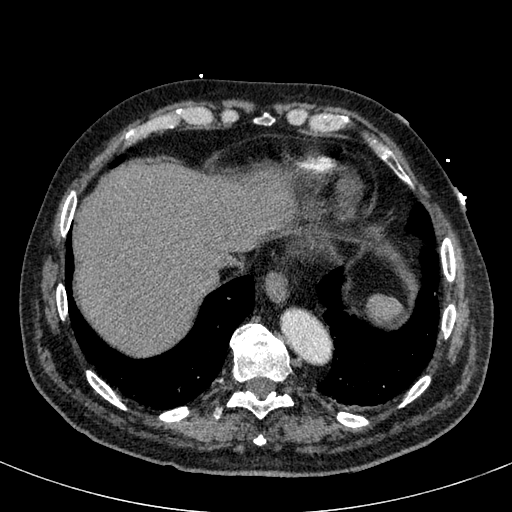
[im 82/295  lung]
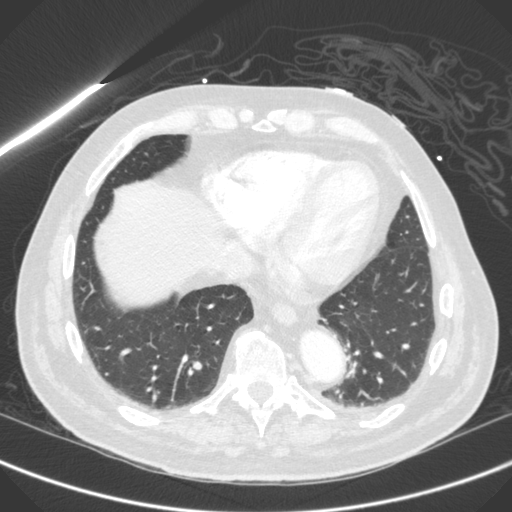
[im 99/295  mediastinal]
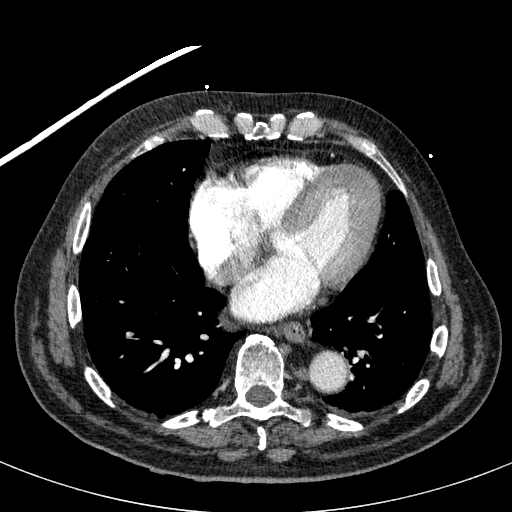
[im 115/295  lung]
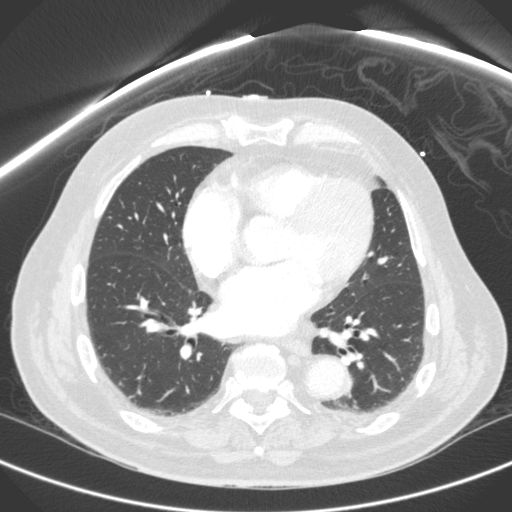
[im 131/295  mediastinal]
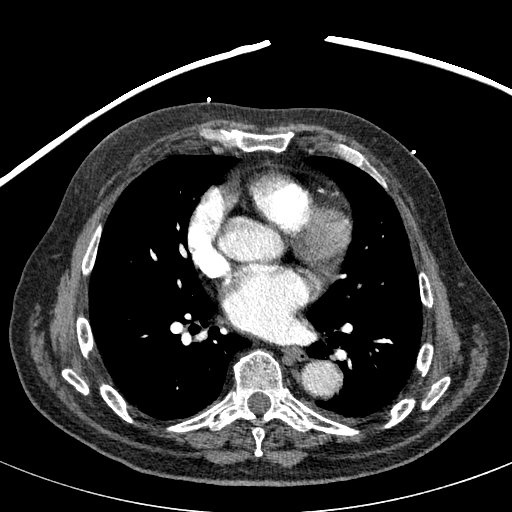
[im 148/295  lung]
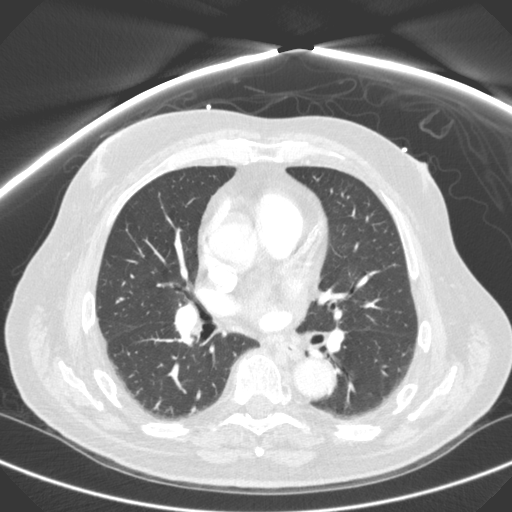
[im 164/295  mediastinal]
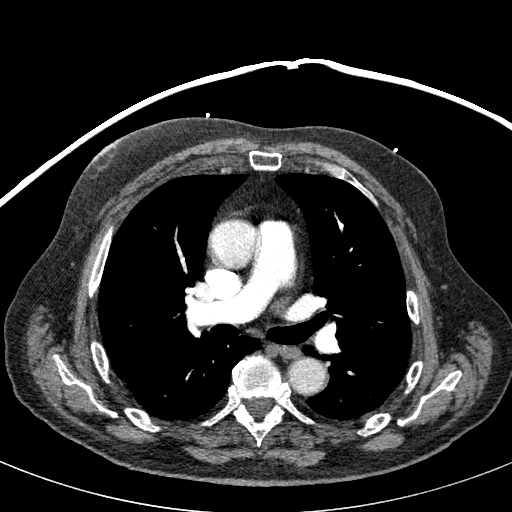
[im 180/295  lung]
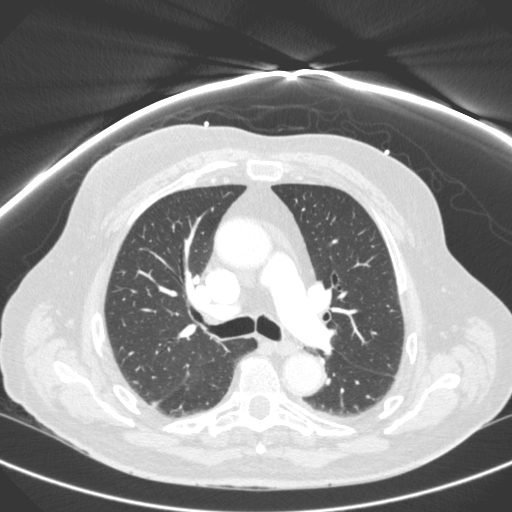
[im 197/295  mediastinal]
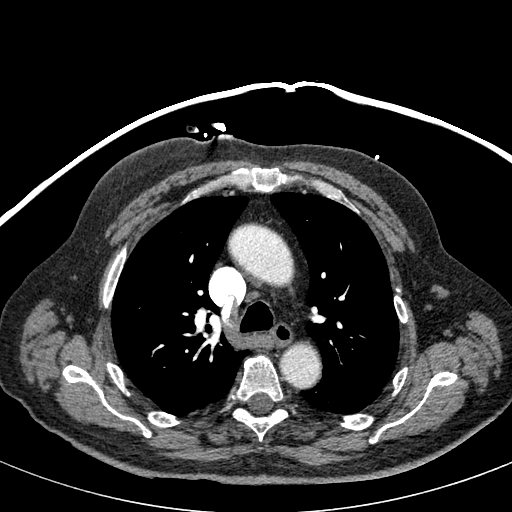
[im 213/295  lung]
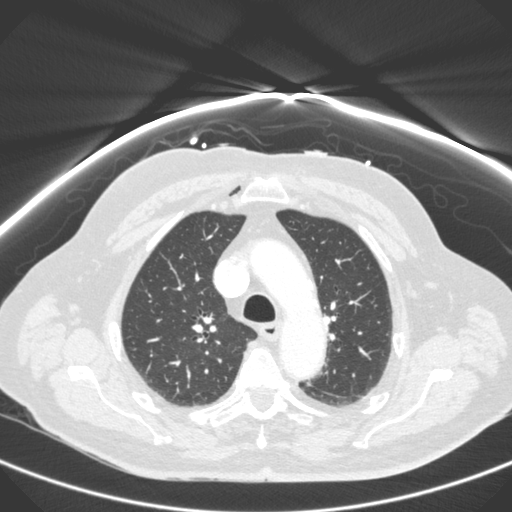
[im 229/295  mediastinal]
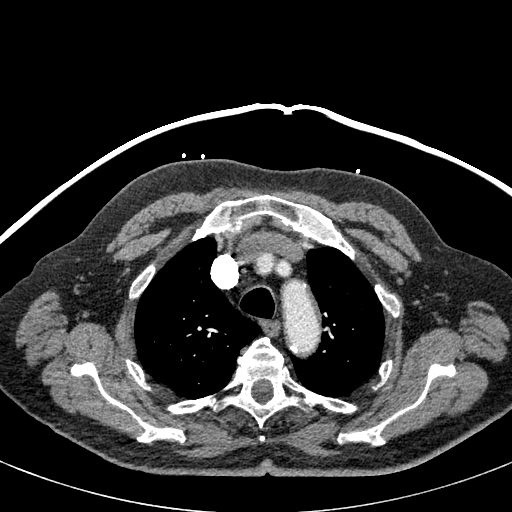
[im 245/295  lung]
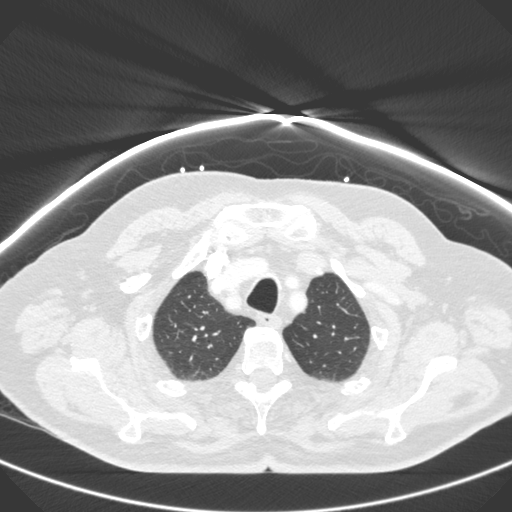
[im 262/295  mediastinal]
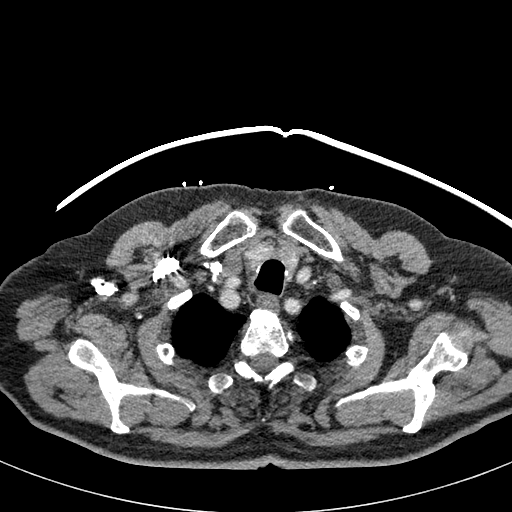
[im 278/295  lung]
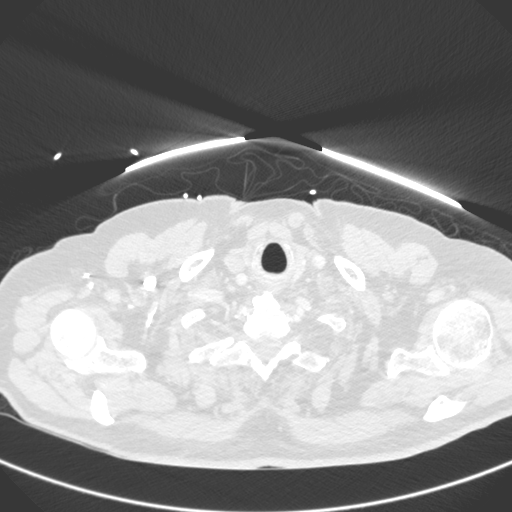

[mpr, coronals, coronal · coronal · 0.74mm/px · 1 of 139 slices shown]
[im 70/139  mediastinal]
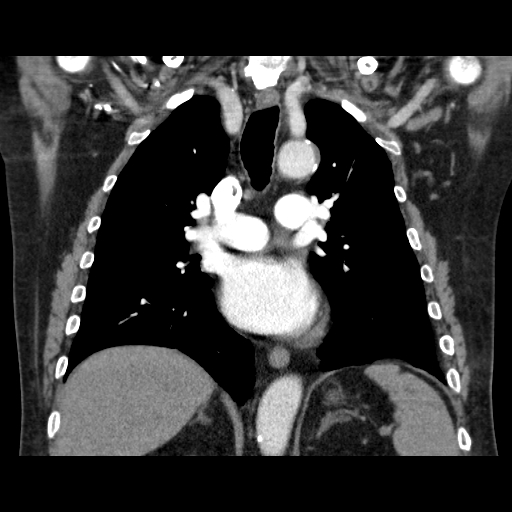

[18 of 36 positions shown; findings below may reference images not displayed]

FINDINGS: Pulmonary embolism is seen in a single subsegmental
posterior right lower lobe pulmonary artery branch.

There is no evidence of thoracic aortic aneurysm or dissection.  No
evidence of mediastinal or hilar masses.  No adenopathy seen
elsewhere within the thorax.  Mild cardiomegaly is noted.

No evidence of pleural or pericardial effusion.  No evidence of
pulmonary infiltrate or mass.  Incidental note is made of a small
hiatal hernia and multiple cholesterol gallstones.

Review of the MIP images confirms the above findings.
IMPRESSION: 1.  Single pulmonary embolus seen in a subsegmental posterior right
lower lobe pulmonary artery branch.
2.  Mild cardiomegaly.
2.  Cholelithiasis and small hiatal hernia.

## 2011-05-06 IMAGING — CR DG CHEST 1V PORT
1 series · 1 of 1 positions shown · non-contrast
Comparison: 05/28/2010

CLINICAL DATA: Altered level of consciousness.  Intracranial
hemorrhage.  Endotracheal tube placement.

PORTABLE CHEST - 1 VIEW

[AP]
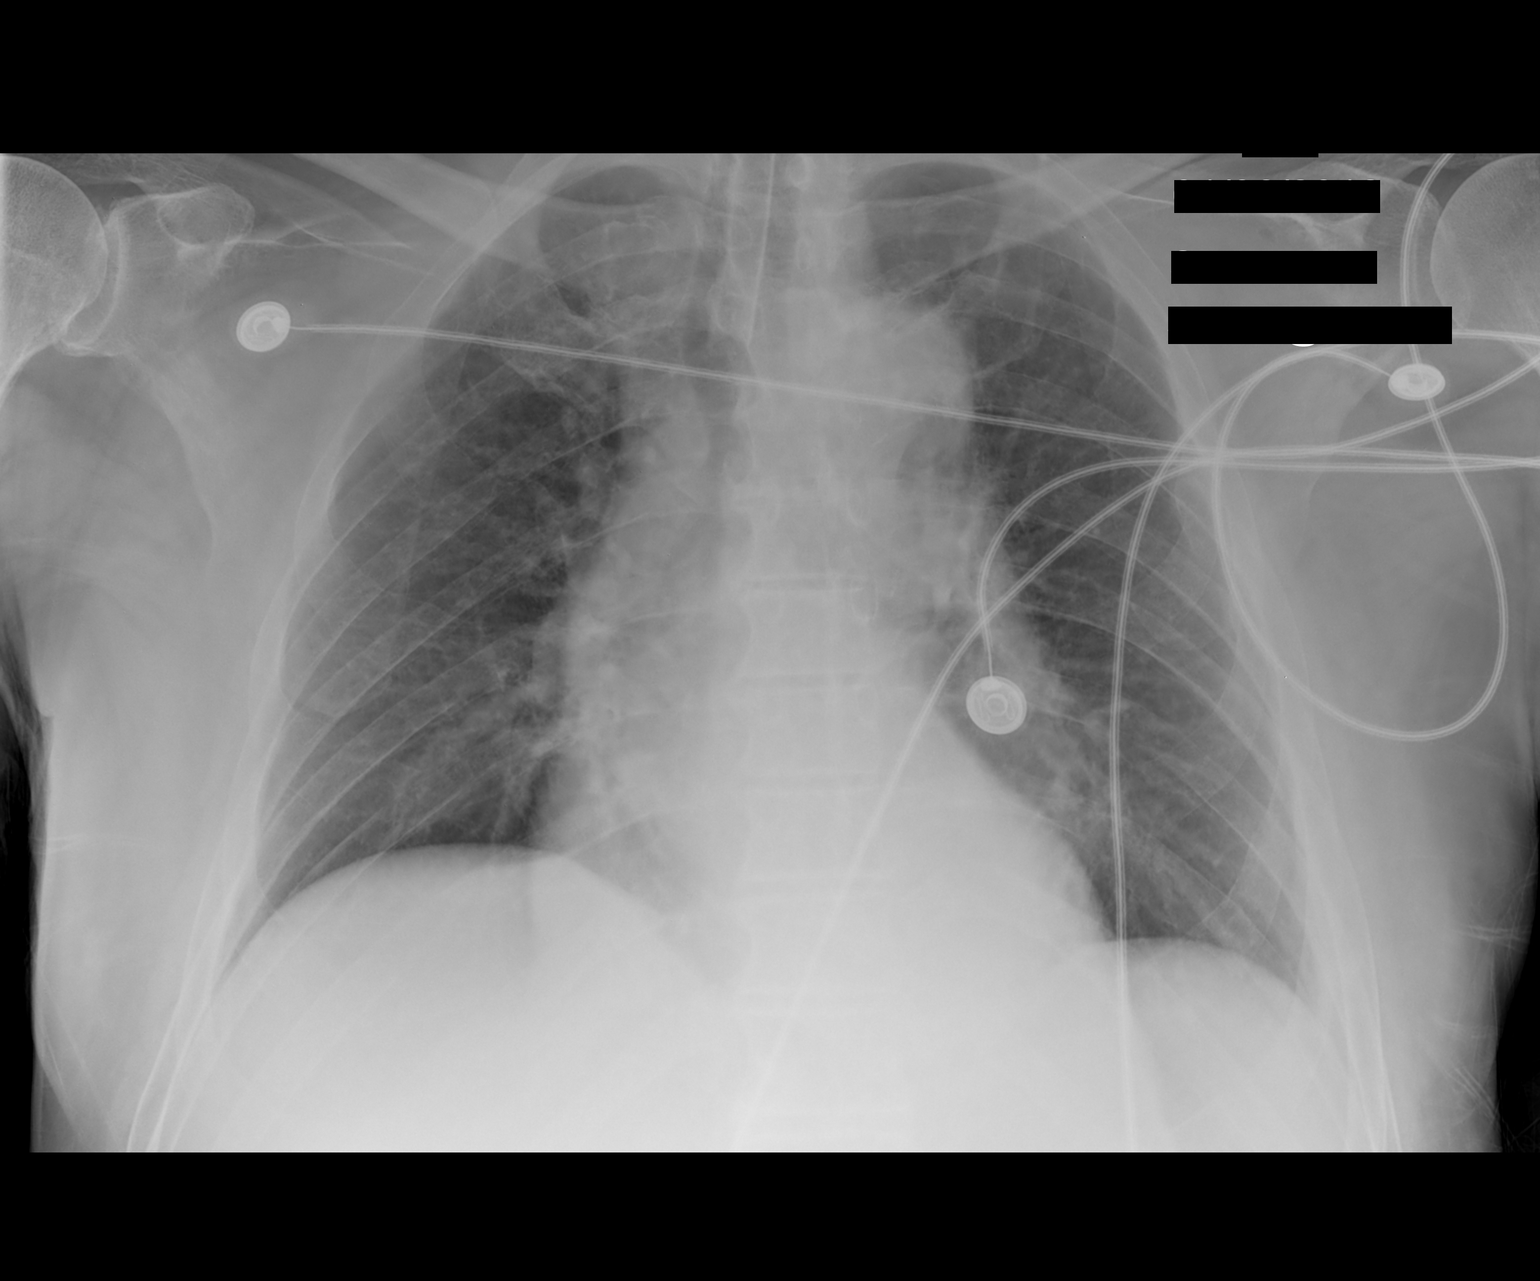

[1 of 1 positions shown; findings below may reference images not displayed]

FINDINGS: Endotracheal tube is in good position.  Heart size and
vascularity are normal and the lungs are clear.
IMPRESSION: Endotracheal tube in good position.  Clear lungs.

## 2011-05-07 IMAGING — CT CT HEAD W/O CM
1 of 2 series · 13 of 30 positions shown, 17 images · non-contrast
Comparison: Head CT 06/23/2010.

CLINICAL DATA: Follow-up intracranial hemorrhage.

CT HEAD WITHOUT CONTRAST
TECHNIQUE: Contiguous axial images were obtained from the base of
the skull through the vertex without contrast.

[Series 2: brain · axial · 0.47mm/px · z∈[+153,+294]mm · 13 of 32 slices shown, 17 images]
[im 3/32  brain]
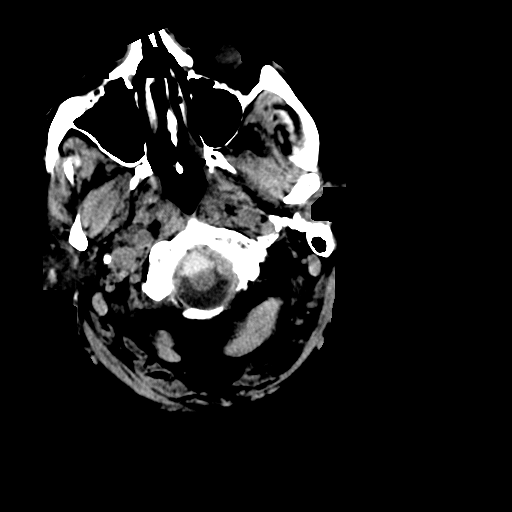
[im 3/32  bone]
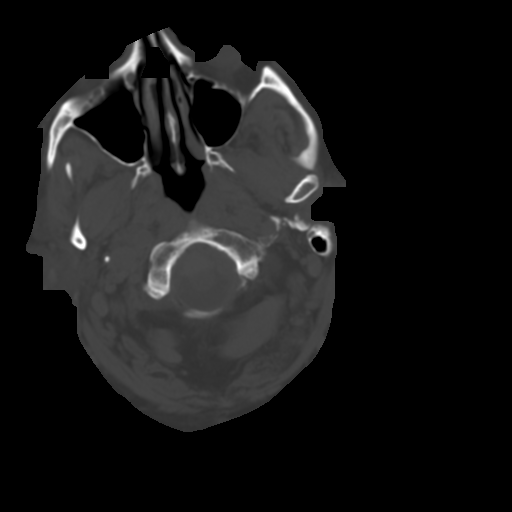
[im 5/32  brain]
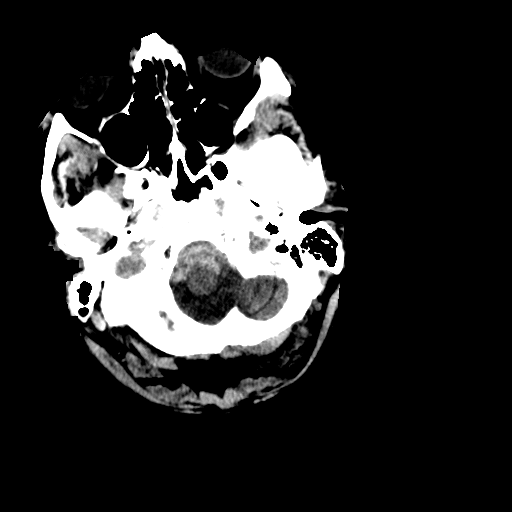
[im 7/32  brain]
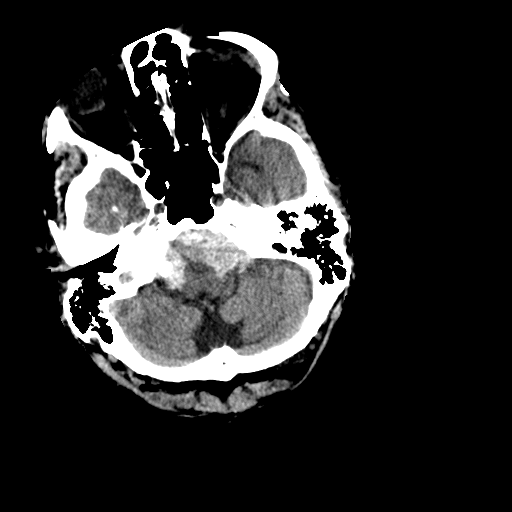
[im 9/32  brain]
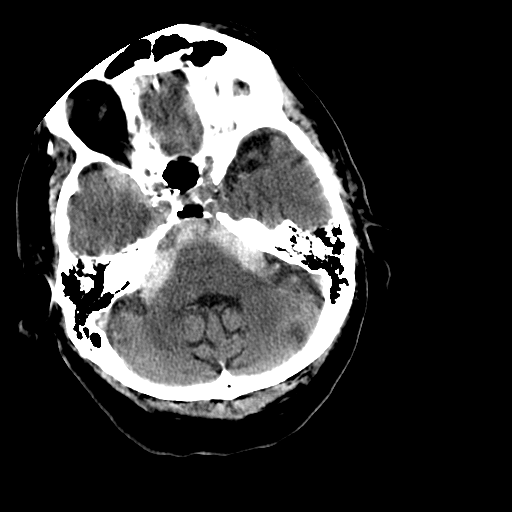
[im 12/32  brain]
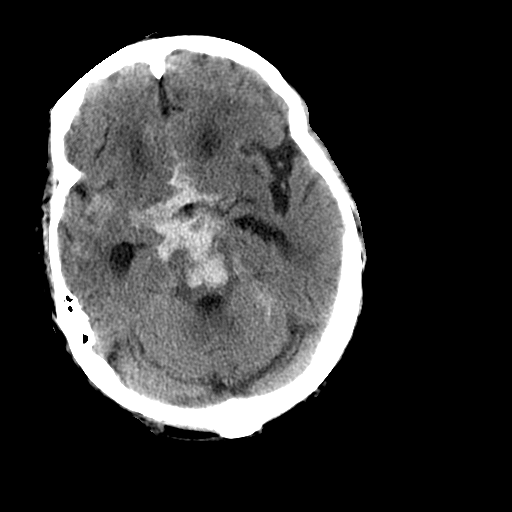
[im 12/32  bone]
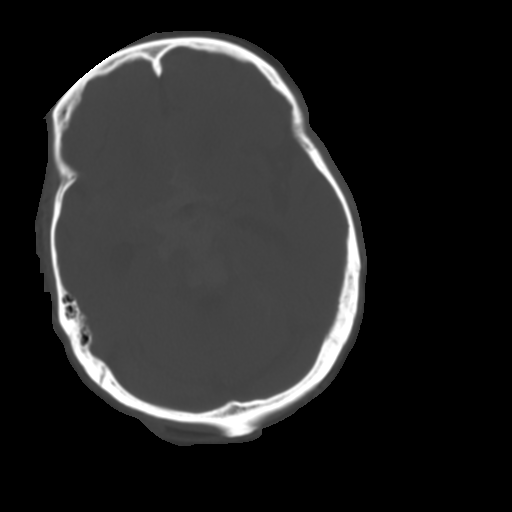
[im 14/32  brain]
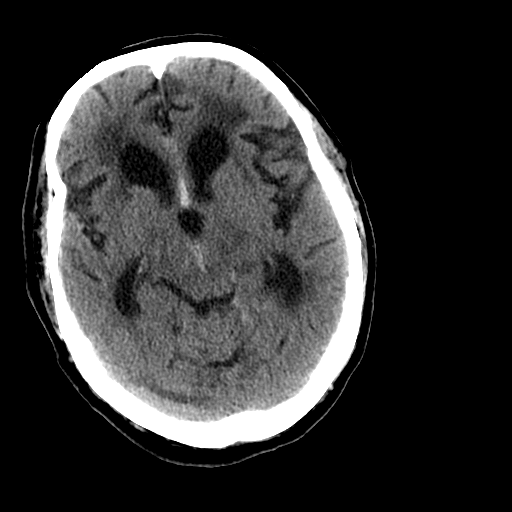
[im 16/32  brain]
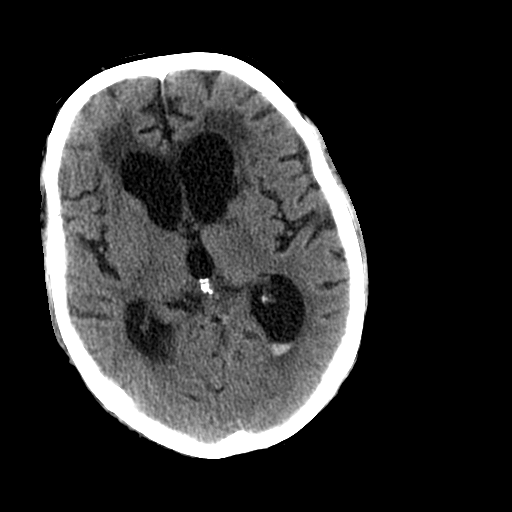
[im 18/32  brain]
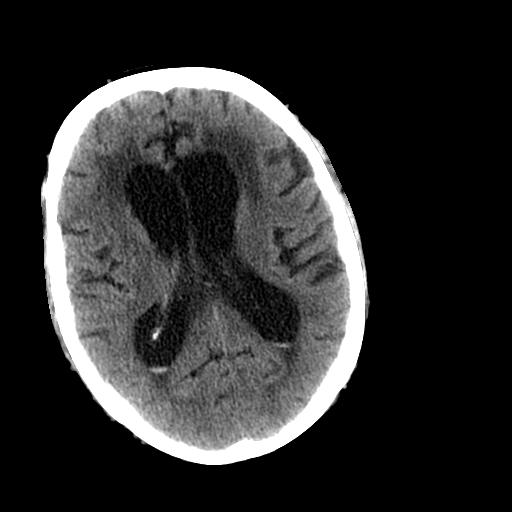
[im 20/32  brain]
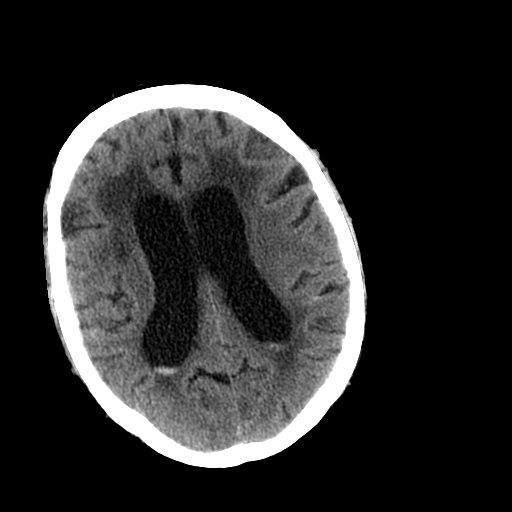
[im 20/32  bone]
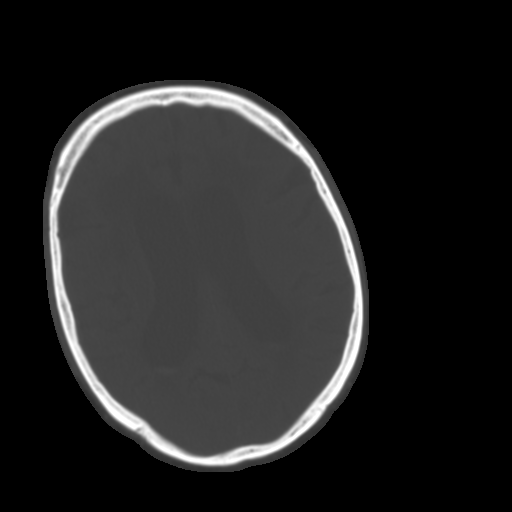
[im 23/32  brain]
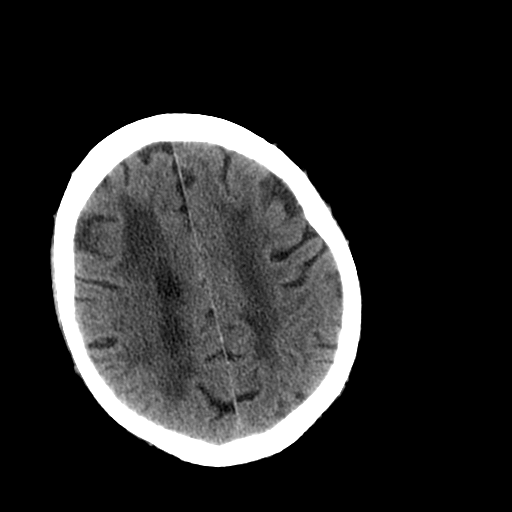
[im 25/32  brain]
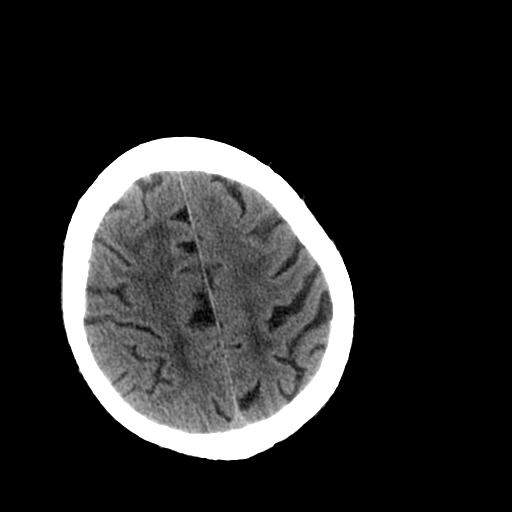
[im 27/32  brain]
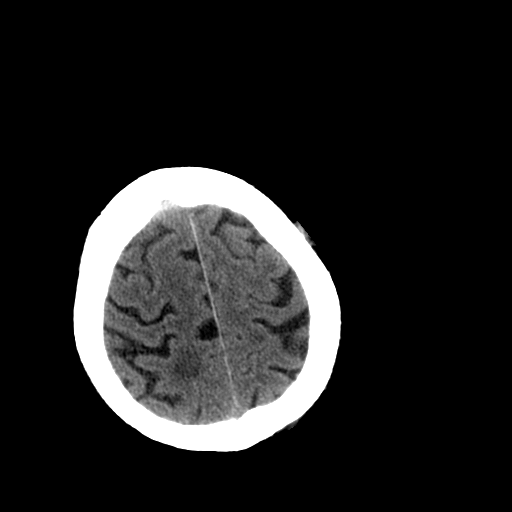
[im 29/32  brain]
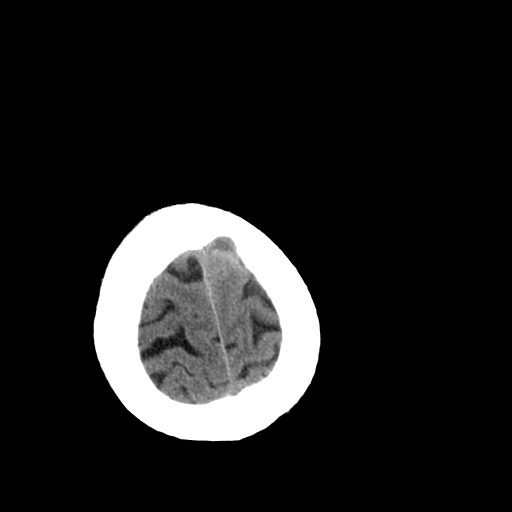
[im 29/32  bone]
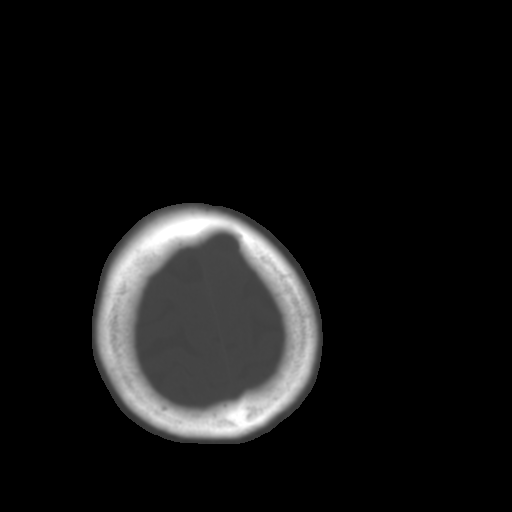

[13 of 30 positions shown; findings below may reference images not displayed]

FINDINGS: Stable CT findings of hemorrhagic brainstem infarct with
subarachnoid and intraventricular hemorrhage.  Stable ventricular
dilatation.

Remote left cerebellar infarct and lacunar type infarcts in the
basal ganglia regions.  Stable periventricular white matter
disease.
IMPRESSION: 1.  No interval change and brainstem hematoma, intraventricular and
subarachnoid hemorrhage.
2.  Stable ventricular dilatation.

## 2016-01-09 NOTE — Progress Notes (Signed)
error
